# Patient Record
Sex: Male | Born: 1990 | Race: Black or African American | Hispanic: No | Marital: Single | Smoking: Current some day smoker
Health system: Southern US, Community
[De-identification: ages and names within clinical notes are randomized; demographics above are authoritative.]

## PROBLEM LIST (undated history)

## (undated) DIAGNOSIS — F1020 Alcohol dependence, uncomplicated: Secondary | ICD-10-CM

---

## 2012-03-26 LAB — COMPREHENSIVE METABOLIC PANEL
Albumin: 3.9 g/dL (ref 3.8–5.6)
Alkaline Phosphatase: 85 U/L — ABNORMAL LOW (ref 98–317)
Anion Gap: 6 — ABNORMAL LOW (ref 7–16)
BUN: 11 mg/dL (ref 7–18)
Bilirubin,Total: 0.2 mg/dL (ref 0.2–1.0)
Calcium, Total: 9 mg/dL (ref 9.0–10.7)
Creatinine: 1.14 mg/dL (ref 0.60–1.30)
Glucose: 83 mg/dL (ref 65–99)
Potassium: 4 mmol/L (ref 3.5–5.1)
SGOT(AST): 27 U/L (ref 10–41)
SGPT (ALT): 16 U/L (ref 12–78)
Total Protein: 8 g/dL (ref 6.4–8.6)

## 2012-03-26 LAB — CBC
HCT: 41.1 % (ref 40.0–52.0)
HGB: 13.7 g/dL (ref 13.0–18.0)
MCHC: 33.4 g/dL (ref 32.0–36.0)
MCV: 90 fL (ref 80–100)
Platelet: 236 10*3/uL (ref 150–440)
RBC: 4.59 10*6/uL (ref 4.40–5.90)
WBC: 7.4 10*3/uL (ref 3.8–10.6)

## 2012-03-26 LAB — URINALYSIS, COMPLETE
Bacteria: NONE SEEN
Blood: NEGATIVE
Glucose,UR: NEGATIVE mg/dL (ref 0–75)
Ketone: NEGATIVE
Leukocyte Esterase: NEGATIVE
Protein: NEGATIVE
Specific Gravity: 1.024 (ref 1.003–1.030)
WBC UR: 1 /HPF (ref 0–5)

## 2012-03-26 LAB — DRUG SCREEN, URINE
Barbiturates, Ur Screen: NEGATIVE (ref ?–200)
Benzodiazepine, Ur Scrn: NEGATIVE (ref ?–200)
Cannabinoid 50 Ng, Ur ~~LOC~~: POSITIVE (ref ?–50)
Cocaine Metabolite,Ur ~~LOC~~: NEGATIVE (ref ?–300)
MDMA (Ecstasy)Ur Screen: NEGATIVE (ref ?–500)
Phencyclidine (PCP) Ur S: NEGATIVE (ref ?–25)
Tricyclic, Ur Screen: NEGATIVE (ref ?–1000)

## 2012-03-26 LAB — TSH: Thyroid Stimulating Horm: 1.26 u[IU]/mL

## 2012-03-26 LAB — ETHANOL: Ethanol: <3 mg/dL

## 2012-03-26 LAB — ACETAMINOPHEN LEVEL: Acetaminophen: <2 ug/mL

## 2012-03-27 ENCOUNTER — Inpatient Hospital Stay: Payer: Self-pay | Admitting: Psychiatry

## 2012-04-17 ENCOUNTER — Emergency Department: Payer: Self-pay | Admitting: Emergency Medicine

## 2012-10-19 ENCOUNTER — Emergency Department: Payer: Self-pay | Admitting: Emergency Medicine

## 2014-11-05 NOTE — H&P (Signed)
PATIENT NAME:  Ryan Graham, TRAVERRIS O MR#:  [id number] DATE OF BIRTH:  [birth year]  DATE OF ADMISSION:  03/27/2012  REFERRING PHYSICIAN: Daryel NovemberJonathan Williams, MD   ATTENDING PHYSICIAN: Kristine LineaJolanta Yazaira Speas, MD    IDENTIFYING DATA: Mr. Ryan Graham is a 24 year old male with no past psychiatric history.   CHIEF COMPLAINT: "My thoughts are racing".   HISTORY OF PRESENT ILLNESS: Mr. Ryan Graham came to the Emergency Room after he overdosed on Bactrim. He could not explain his reasoning but reports that he has been suffering racing thoughts, mood swings, hyperactivity, lots of energy, some confusion at times that prevent him from achieving in life what he set out to do. 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 He says that he frequently has difficulties completing tasks, is scatterbrained and doing no good. He reports that he had a cyst removed and was prescribed narcotic pain killers for it but ran out very quickly. He was also prescribed the Bactrim. He stopped taking it at some point and that is why he had leftover Bactrim at home. He does not complain of any pain or discomfort at the site of surgery. He denies frank psychotic symptoms. He denies alcohol, prescription pills, or illicit drug abuse.   PAST PSYCHIATRIC HISTORY: He states that he does not remember anytime in his past where his thoughts were not racing and when it was not difficult for him to focus. He denies any prior psychiatric treatment. No suicide attempts. No substance abuse problems.   FAMILY PSYCHIATRIC HISTORY: None reported.   PAST MEDICAL HISTORY: None.   ALLERGIES: No known drug allergies.   MEDICATIONS ON ADMISSION: None.   SOCIAL HISTORY: As above, he lives with his aunt and uncle. His mother reportedly has a drug problem and has been in and out of his life. He graduated from high school and got  enrolled in community college classes. He does not have health insurance but most likely would qualify for Medicaid.  REVIEW OF SYSTEMS: CONSTITUTIONAL: No fevers or chills. No weight changes. EYES: No double or blurred vision. ENT: No hearing loss. RESPIRATORY: No shortness of breath or cough. CARDIOVASCULAR: No chest pain or orthopnea. GASTROINTESTINAL: No abdominal pain, nausea, vomiting, or diarrhea. GU: No incontinence or frequency. ENDOCRINE: No heat or cold intolerance. LYMPHATIC: No anemia or easy bruising. INTEGUMENTARY: No acne or rash. MUSCULOSKELETAL: No muscle or joint pain. NEUROLOGIC: No tingling or weakness. PSYCHIATRIC: See history of present illness for details.   PHYSICAL EXAMINATION:   VITAL SIGNS: Blood pressure 107/59, pulse 57, respirations 20, temperature 98.2.   GENERAL: This is a slender tall young male in no acute distress.   HEENT: The pupils are equal, round, and reactive to light. Sclerae anicteric.   NECK: Supple. No thyromegaly.   LUNGS: Clear to auscultation. No dullness to percussion.   HEART: Regular rhythm and rate. No murmurs, rubs, or gallops.   ABDOMEN: Soft, nontender, nondistended. Positive bowel sounds.   MUSCULOSKELETAL: Normal muscle strength in all extremities.   SKIN: No rashes or bruises.   LYMPHATIC: No cervical adenopathy.   NEUROLOGIC: Cranial nerves II through XII are intact. Normal gait.   LABORATORY DATA: Chemistries are within normal limits. Blood alcohol level 0. LFTs within normal limits. TSH 1.26. Urine tox screen positive for cannabinoids. CBC within normal limits. Urinalysis is not suggestive of urinary tract infection.  Serum acetaminophen and salicylates are low.   EKG sinus bradycardia, septal infarct of undetermined age. Abnormal EKG.  MENTAL STATUS EXAMINATION ON ADMISSION: The patient is examined in the Emergency Room. He is alert and oriented to person, place, time, and situation. He is pleasant, polite, and cooperative.  He is wearing burgundy scrubs and a yellow shirt. He maintains good eye contact. His speech is soft. Mood is depressed with flat affect. Thought processing is logical and goal oriented. Thought content he denies suicidal or homicidal ideation but was admitted after a suicide attempt by Bactrim overdose. There are no delusions or paranoia. There are no auditory or visual hallucinations. His cognition is grossly intact. He registers 3 out of 3 and recalls 3 out of 3 objects after five minutes. He can spell world forward and backward. He knows the current president. His insight and judgment are questionable.   SUICIDE RISK ASSESSMENT ON ADMISSION: This is a patient with no past psychiatric history who attempted suicide when overwhelmed with the stress of starting school. He is no longer suicidal and is able to contract for safety.   ASSESSMENT:  AXIS I:  1. Mood disorder, not otherwise specified.  2. Marijuana abuse.   AXIS II: Deferred.   AXIS III: Deferred.   AXIS IV: Mental illness, access to care, stress of school.   AXIS V: GAF on admission 25.   PLAN: The patient was admitted to [facility] Medicine Unit for safety, stabilization, and medication management. He was initially placed on suicide precautions and was closely monitored for any unsafe behaviors. He underwent full psychiatric and risk assessment. He received pharmacotherapy, individual and group psychotherapy, substance abuse counseling, and support from therapeutic milieu.  1. Suicidal ideation. This has resolved.  2. Mood. Will start him on Tegretol for mood stabilization and trazodone for sleep.  3. Disposition. He will be discharged to home with his mother.   ____________________________ Ellin Goodie. Jennet Maduro, MD jbp:drc D: 03/28/2012 17:42:14 ET T: 03/29/2012 06:05:28 ET JOB#: [id number]  cc: Palma Buster B. Jennet Maduro, MD, <Dictator> Shari Prows MD ELECTRONICALLY SIGNED 03/31/2012 9:01

## 2014-11-05 NOTE — Consult Note (Signed)
Brief Consult Note: Diagnosis: Mood disorder NOS.   Patient was seen by consultant.   Recommend to proceed with surgery or procedure.   Recommend further assessment or treatment.   Orders entered.   Discussed with Attending MD.   Comments: Will admit to psychiatry.  Electronic Signatures: Kristine LineaPucilowska, Rema Lievanos (MD)  (Signed 09-Sep-13 14:54)  Authored: Brief Consult Note   Last Updated: 09-Sep-13 14:54 by Kristine LineaPucilowska, Loi Rennaker (MD)

## 2018-10-12 ENCOUNTER — Encounter: Payer: Self-pay | Admitting: Emergency Medicine

## 2018-10-12 ENCOUNTER — Emergency Department
Admission: EM | Admit: 2018-10-12 | Discharge: 2018-10-12 | Disposition: A | Discharge status: Home / Self Care | Payer: Self-pay | Attending: Emergency Medicine | Admitting: Emergency Medicine

## 2018-10-12 ENCOUNTER — Other Ambulatory Visit: Payer: Self-pay

## 2018-10-12 DIAGNOSIS — K047 Periapical abscess without sinus: Secondary | ICD-10-CM | POA: Insufficient documentation

## 2018-10-12 DIAGNOSIS — F17200 Nicotine dependence, unspecified, uncomplicated: Secondary | ICD-10-CM | POA: Insufficient documentation

## 2018-10-12 MED ORDER — TRAMADOL HCL 50 MG PO TABS: 50.0000 mg | ORAL_TABLET | Freq: Four times a day (QID) | ORAL | 0 refills | Status: DC | PRN

## 2018-10-12 MED ORDER — CEFTRIAXONE SODIUM 1 G IJ SOLR
1.0000 g | Freq: Once | INTRAMUSCULAR | Status: AC
  Administered 2018-10-12: 1 g via INTRAMUSCULAR
  Filled 2018-10-12: qty 10

## 2018-10-12 MED ORDER — LIDOCAINE HCL (PF) 1 % IJ SOLN
5.0000 mL | Freq: Once | INTRAMUSCULAR | Status: AC
  Administered 2018-10-12: 5 mL via INTRADERMAL
  Filled 2018-10-12: qty 5

## 2018-10-12 MED ORDER — AMOXICILLIN 500 MG PO CAPS: 500.0000 mg | ORAL_CAPSULE | Freq: Three times a day (TID) | ORAL | 0 refills | Status: DC

## 2018-10-12 NOTE — ED Notes (Signed)
See triage note presents with possible dental abscess  Hx of same

## 2018-10-12 NOTE — ED Provider Notes (Signed)
[facility] Emergency Department Provider Note  ____________________________________________   First MD Initiated Contact with Patient 10/12/18 1300     (approximate)  I have reviewed the triage vital signs and the nursing notes.   HISTORY  Chief Complaint Dental Pain    HPI Ryan Graham is a 28 y.o. male presents emergency department complaining of a large dental abscess to the left lower molar.  Symptoms since last Sunday.  He denies fever or chills.  He states he starting to hurt into the jaw and into his ear.  He denies chest pain or shortness of breath.  He does not have a regular dentist but he did see 1 a year ago.    History reviewed. No pertinent past medical history.  There are no active problems to display for this patient.   History reviewed. No pertinent surgical history.  Prior to Admission medications   Medication Sig Start Date End Date Taking? Authorizing Provider  amoxicillin (AMOXIL) 500 MG capsule Take 1 capsule (500 mg total) by mouth 3 (three) times daily. 10/12/18   Beatrice Ziehm, Roselyn Bering, PA-C  traMADol (ULTRAM) 50 MG tablet Take 1 tablet (50 mg total) by mouth every 6 (six) hours as needed. 10/12/18   Faythe Ghee, PA-C    Allergies Patient has no known allergies.  No family history on file.  Social History Social History   Tobacco Use  . Smoking status: Current Some Day Smoker  . Smokeless tobacco: Never Used  Substance Use Topics  . Alcohol use: Not on file  . Drug use: Not on file    Review of Systems  Constitutional: No fever/chills Eyes: No visual changes. ENT: No sore throat.  Positive dental pain and swelling Respiratory: Denies cough Genitourinary: Negative for dysuria. Musculoskeletal: Negative for back pain. Skin: Negative for rash.    ____________________________________________   PHYSICAL EXAM:  VITAL SIGNS: ED Triage Vitals  Enc Vitals Group     BP 10/12/18 1245 132/69     Pulse  Rate 10/12/18 1245 71     Resp 10/12/18 1245 18     Temp 10/12/18 1245 98.8 F (37.1 C)     Temp Source 10/12/18 1245 Oral     SpO2 10/12/18 1245 100 %     Weight 10/12/18 1246 140 lb (63.5 kg)     Height 10/12/18 1246 6\' 3"  (1.905 m)     Head Circumference --      Peak Flow --      Pain Score 10/12/18 1246 10     Pain Loc --      Pain Edu? --      Excl. in GC? --     Constitutional: Alert and oriented. Well appearing and in no acute distress. Eyes: Conjunctivae are normal.  Head: Atraumatic.  Positive for large amount of swelling noted to the left lower jaw Nose: No congestion/rhinnorhea. Mouth/Throat: Mucous membranes are moist.  Positive for a large amount of swelling at the left lower gum.  Area is not fluctuant but is very swollen and tender Neck:  supple no lymphadenopathy noted Cardiovascular: Normal rate, regular rhythm. Heart sounds are normal Respiratory: Normal respiratory effort.  No retractions, lungs c t a  GU: deferred Musculoskeletal: FROM all extremities, warm and well perfused Neurologic:  Normal speech and language.  Skin:  Skin is warm, dry and intact. No rash noted. Psychiatric: Mood and affect are normal. Speech and behavior are normal.  ____________________________________________   LABS (all labs ordered  are listed, but only abnormal results are displayed)  Labs Reviewed - No data to display ____________________________________________   ____________________________________________  RADIOLOGY    ____________________________________________   PROCEDURES  Procedure(s) performed: Rocephin 1 g IM  Procedures    ____________________________________________   INITIAL IMPRESSION / ASSESSMENT AND PLAN / ED COURSE  Pertinent labs & imaging results that were available during my care of the patient were reviewed by me and considered in my medical decision making (see chart for details).   Patient is 28 year old male presents emergency  department, complaining of a dental abscess for 4 days.  Patient denies chest pain shortness of breath or fever.  Physical exam shows a large amount of swelling noted to the left lower jaw.  Gum is grossly swollen no fluctuance is noted.  Patient does have poor dentition.  Due to the amount of infection the patient was given Rocephin 1 g IM.  He was then given a prescription for amoxicillin and tramadol.  He is to follow-up with his regular dentist.  States he understands will comply.  He was discharged in stable condition.     As part of my medical decision making, I reviewed the following data within the electronic MEDICAL RECORD NUMBER Nursing notes reviewed and incorporated, Old chart reviewed, Notes from prior ED visits and [place] Controlled Substance Database  ____________________________________________   FINAL CLINICAL IMPRESSION(S) / ED DIAGNOSES  Final diagnoses:  Dental abscess      NEW MEDICATIONS STARTED DURING THIS VISIT:  New Prescriptions   AMOXICILLIN (AMOXIL) 500 MG CAPSULE    Take 1 capsule (500 mg total) by mouth 3 (three) times daily.   TRAMADOL (ULTRAM) 50 MG TABLET    Take 1 tablet (50 mg total) by mouth every 6 (six) hours as needed.     Note:  This document was prepared using Dragon voice recognition software and may include unintentional dictation errors.    Faythe Ghee, PA-C 10/12/18 1433    Dionne Bucy, MD 10/12/18 2115

## 2018-10-12 NOTE — Discharge Instructions (Addendum)
Follow-up with your regular dentist for evaluation of the dental abscess.  Return emergency department worsening.  Use medications as prescribed.  Also take Tylenol and ibuprofen to help with pain and swelling.

## 2018-10-12 NOTE — ED Triage Notes (Signed)
Patient presents to the ED with left sided dental pain that began on Sunday.  Patient's left cheek appears swollen.  Patient appears uncomfortable.

## 2019-02-23 ENCOUNTER — Ambulatory Visit: Payer: Self-pay

## 2019-03-02 ENCOUNTER — Ambulatory Visit: Payer: Self-pay

## 2019-03-07 ENCOUNTER — Ambulatory Visit: Payer: Self-pay

## 2019-03-14 ENCOUNTER — Ambulatory Visit: Payer: Self-pay | Admitting: Physician Assistant

## 2019-03-14 ENCOUNTER — Encounter: Payer: Self-pay | Admitting: Physician Assistant

## 2019-03-14 ENCOUNTER — Other Ambulatory Visit: Payer: Self-pay

## 2019-03-14 DIAGNOSIS — Z202 Contact with and (suspected) exposure to infections with a predominantly sexual mode of transmission: Secondary | ICD-10-CM

## 2019-03-14 DIAGNOSIS — Z113 Encounter for screening for infections with a predominantly sexual mode of transmission: Secondary | ICD-10-CM

## 2019-03-14 MED ORDER — AZITHROMYCIN 500 MG PO TABS
1000.0000 mg | ORAL_TABLET | Freq: Once | ORAL | Status: AC
  Administered 2019-03-14: 1000 mg via ORAL

## 2019-03-14 MED ORDER — CEFTRIAXONE SODIUM 250 MG IJ SOLR
250.0000 mg | Freq: Once | INTRAMUSCULAR | Status: AC
  Administered 2019-03-14: 250 mg via INTRAMUSCULAR

## 2019-03-14 NOTE — Progress Notes (Signed)
    STI clinic/screening visit  Subjective:  Ryan Graham is a 28 y.o. male being seen today for an STI screening visit. The patient reports they do have symptoms.  Patient has the following medical conditions:  There are no active problems to display for this patient.    Chief Complaint  Patient presents with  . SEXUALLY TRANSMITTED DISEASE    HPI  Patient reports that he is a contact to [facility] and having symptoms.  Declines screening exam, testing and blood work today.  Requests treatment as a contact only.  See flowsheet for further details and programmatic requirements.    The following portions of the patient's history were reviewed and updated as appropriate: allergies, current medications, past medical history, past social history, past surgical history and problem list.  Objective:  There were no vitals filed for this visit.  Physical Exam Constitutional:      General: He is not in acute distress.    Appearance: Normal appearance.  HENT:     Head: Normocephalic and atraumatic.  Pulmonary:     Effort: Pulmonary effort is normal.  Neurological:     Mental Status: He is alert and oriented to person, place, and time.  Psychiatric:        Mood and Affect: Mood normal.        Behavior: Behavior normal.        Thought Content: Thought content normal.        Judgment: Judgment normal.       Assessment and Plan:  Ryan Graham is a 28 y.o. male presenting to the [facility] Department for STI screening  1. Screening for STD (sexually transmitted disease) Patient is having symptoms and is a contact to [facility]. Rec condoms with all sex.  2. Gonorrhea contact Will treat with Ceftriaxone 250 mg IM and Azithromycin 1g po DOT today. No sex for 7 days and until after partner completes treatment. RTC if vomits <2 hr after taking medicine for re-treatment.  - azithromycin (ZITHROMAX) tablet 1,000 mg - cefTRIAXone (ROCEPHIN) injection 250 mg     No  follow-ups on file.  No future appointments.  Jerene Dilling, PA

## 2020-10-07 ENCOUNTER — Ambulatory Visit: Payer: Self-pay

## 2021-03-09 ENCOUNTER — Ambulatory Visit: Payer: Self-pay

## 2021-06-22 ENCOUNTER — Other Ambulatory Visit: Payer: Self-pay

## 2021-06-22 ENCOUNTER — Emergency Department (HOSPITAL_COMMUNITY): Payer: Self-pay

## 2021-06-22 ENCOUNTER — Encounter (HOSPITAL_COMMUNITY): Payer: Self-pay | Admitting: Emergency Medicine

## 2021-06-22 ENCOUNTER — Inpatient Hospital Stay (HOSPITAL_COMMUNITY)
Admission: EM | Admit: 2021-06-22 | Discharge: 2021-06-24 | DRG: 894 | Discharge status: Against Medical Advice | Payer: Self-pay | Attending: Critical Care Medicine | Admitting: Critical Care Medicine

## 2021-06-22 DIAGNOSIS — R569 Unspecified convulsions: Secondary | ICD-10-CM

## 2021-06-22 DIAGNOSIS — M6282 Rhabdomyolysis: Secondary | ICD-10-CM | POA: Diagnosis present

## 2021-06-22 DIAGNOSIS — E876 Hypokalemia: Secondary | ICD-10-CM | POA: Insufficient documentation

## 2021-06-22 DIAGNOSIS — F10239 Alcohol dependence with withdrawal, unspecified: Principal | ICD-10-CM | POA: Diagnosis present

## 2021-06-22 DIAGNOSIS — Z781 Physical restraint status: Secondary | ICD-10-CM

## 2021-06-22 DIAGNOSIS — Z20822 Contact with and (suspected) exposure to covid-19: Secondary | ICD-10-CM | POA: Diagnosis present

## 2021-06-22 DIAGNOSIS — G9341 Metabolic encephalopathy: Secondary | ICD-10-CM | POA: Diagnosis present

## 2021-06-22 DIAGNOSIS — F10939 Alcohol use, unspecified with withdrawal, unspecified: Secondary | ICD-10-CM | POA: Diagnosis present

## 2021-06-22 DIAGNOSIS — F1910 Other psychoactive substance abuse, uncomplicated: Secondary | ICD-10-CM

## 2021-06-22 DIAGNOSIS — F1513 Other stimulant abuse with withdrawal: Secondary | ICD-10-CM | POA: Diagnosis present

## 2021-06-22 DIAGNOSIS — D696 Thrombocytopenia, unspecified: Secondary | ICD-10-CM | POA: Diagnosis present

## 2021-06-22 DIAGNOSIS — F17210 Nicotine dependence, cigarettes, uncomplicated: Secondary | ICD-10-CM | POA: Diagnosis present

## 2021-06-22 DIAGNOSIS — R451 Restlessness and agitation: Secondary | ICD-10-CM | POA: Diagnosis present

## 2021-06-22 LAB — COMPREHENSIVE METABOLIC PANEL
ALT: 63 U/L — ABNORMAL HIGH (ref 0–44)
ALT: 54 U/L — ABNORMAL HIGH (ref 0–44)
ALT: 54 U/L — ABNORMAL HIGH (ref 0–44)
AST: 141 U/L — ABNORMAL HIGH (ref 15–41)
AST: 141 U/L — ABNORMAL HIGH (ref 15–41)
AST: 144 U/L — ABNORMAL HIGH (ref 15–41)
Albumin: 4.4 g/dL (ref 3.5–5.0)
Albumin: 3.8 g/dL (ref 3.5–5.0)
Albumin: 3.6 g/dL (ref 3.5–5.0)
Alkaline Phosphatase: 113 U/L (ref 38–126)
Alkaline Phosphatase: 94 U/L (ref 38–126)
Alkaline Phosphatase: 83 U/L (ref 38–126)
Anion gap: 10 (ref 5–15)
Anion gap: 9 (ref 5–15)
Anion gap: 9 (ref 5–15)
BUN: <5 mg/dL — ABNORMAL LOW (ref 6–20)
BUN: <5 mg/dL — ABNORMAL LOW (ref 6–20)
BUN: <5 mg/dL — ABNORMAL LOW (ref 6–20)
CO2: 26 mmol/L (ref 22–32)
CO2: 23 mmol/L (ref 22–32)
CO2: 24 mmol/L (ref 22–32)
Calcium: 9.7 mg/dL (ref 8.9–10.3)
Calcium: 9.1 mg/dL (ref 8.9–10.3)
Calcium: 8.8 mg/dL — ABNORMAL LOW (ref 8.9–10.3)
Chloride: 97 mmol/L — ABNORMAL LOW (ref 98–111)
Chloride: 99 mmol/L (ref 98–111)
Chloride: 98 mmol/L (ref 98–111)
Creatinine, Ser: 0.97 mg/dL (ref 0.61–1.24)
Creatinine, Ser: 0.97 mg/dL (ref 0.61–1.24)
Creatinine, Ser: 0.98 mg/dL (ref 0.61–1.24)
GFR, Estimated: >60 mL/min (ref >60)
GFR, Estimated: >60 mL/min (ref >60)
GFR, Estimated: >60 mL/min (ref >60)
Glucose, Bld: 108 mg/dL — ABNORMAL HIGH (ref 70–99)
Glucose, Bld: 99 mg/dL (ref 70–99)
Glucose, Bld: 107 mg/dL — ABNORMAL HIGH (ref 70–99)
Potassium: 3 mmol/L — ABNORMAL LOW (ref 3.5–5.1)
Potassium: 3.8 mmol/L (ref 3.5–5.1)
Potassium: 3.5 mmol/L (ref 3.5–5.1)
Sodium: 133 mmol/L — ABNORMAL LOW (ref 135–145)
Sodium: 131 mmol/L — ABNORMAL LOW (ref 135–145)
Sodium: 131 mmol/L — ABNORMAL LOW (ref 135–145)
Total Bilirubin: 0.6 mg/dL (ref 0.3–1.2)
Total Bilirubin: 0.6 mg/dL (ref 0.3–1.2)
Total Bilirubin: 0.7 mg/dL (ref 0.3–1.2)
Total Protein: 8.1 g/dL (ref 6.5–8.1)
Total Protein: 7.2 g/dL (ref 6.5–8.1)
Total Protein: 6.9 g/dL (ref 6.5–8.1)

## 2021-06-22 LAB — CBC WITH DIFFERENTIAL/PLATELET
Abs Immature Granulocytes: 0.04 10*3/uL (ref 0.00–0.07)
Basophils Absolute: 0 10*3/uL (ref 0.0–0.1)
Basophils Relative: 0 %
Eosinophils Absolute: 0 10*3/uL (ref 0.0–0.5)
Eosinophils Relative: 0 %
HCT: 39.9 % (ref 39.0–52.0)
Hemoglobin: 14 g/dL (ref 13.0–17.0)
Immature Granulocytes: 1 %
Lymphocytes Relative: 13 %
Lymphs Abs: 1.1 10*3/uL (ref 0.7–4.0)
MCH: 32.7 pg (ref 26.0–34.0)
MCHC: 35.1 g/dL (ref 30.0–36.0)
MCV: 93.2 fL (ref 80.0–100.0)
Monocytes Absolute: 0.9 10*3/uL (ref 0.1–1.0)
Monocytes Relative: 11 %
Neutro Abs: 6.3 10*3/uL (ref 1.7–7.7)
Neutrophils Relative %: 75 %
Platelets: 159 10*3/uL (ref 150–400)
RBC: 4.28 MIL/uL (ref 4.22–5.81)
RDW: 17 % — ABNORMAL HIGH (ref 11.5–15.5)
WBC: 8.4 10*3/uL (ref 4.0–10.5)
nRBC: 0 % (ref 0.0–0.2)

## 2021-06-22 LAB — HEPATITIS PANEL, ACUTE
HCV Ab: NONREACTIVE
Hep A IgM: NONREACTIVE
Hep B C IgM: NONREACTIVE
Hepatitis B Surface Ag: NONREACTIVE

## 2021-06-22 LAB — CBC
HCT: 35.9 % — ABNORMAL LOW (ref 39.0–52.0)
Hemoglobin: 12.5 g/dL — ABNORMAL LOW (ref 13.0–17.0)
MCH: 32.6 pg (ref 26.0–34.0)
MCHC: 34.8 g/dL (ref 30.0–36.0)
MCV: 93.5 fL (ref 80.0–100.0)
Platelets: 132 10*3/uL — ABNORMAL LOW (ref 150–400)
RBC: 3.84 MIL/uL — ABNORMAL LOW (ref 4.22–5.81)
RDW: 16.7 % — ABNORMAL HIGH (ref 11.5–15.5)
WBC: 8.8 10*3/uL (ref 4.0–10.5)
nRBC: 0 % (ref 0.0–0.2)

## 2021-06-22 LAB — RAPID URINE DRUG SCREEN, HOSP PERFORMED
Amphetamines: POSITIVE — AB
Barbiturates: NOT DETECTED
Benzodiazepines: NOT DETECTED
Cocaine: NOT DETECTED
Opiates: NOT DETECTED
Tetrahydrocannabinol: NOT DETECTED

## 2021-06-22 LAB — ACETAMINOPHEN LEVEL: Acetaminophen (Tylenol), Serum: <10 ug/mL — ABNORMAL LOW (ref 10–30)

## 2021-06-22 LAB — CK
Total CK: 3786 U/L — ABNORMAL HIGH (ref 49–397)
Total CK: 4812 U/L — ABNORMAL HIGH (ref 49–397)

## 2021-06-22 LAB — CBG MONITORING, ED: Glucose-Capillary: 131 mg/dL — ABNORMAL HIGH (ref 70–99)

## 2021-06-22 LAB — GLUCOSE, CAPILLARY: Glucose-Capillary: 105 mg/dL — ABNORMAL HIGH (ref 70–99)

## 2021-06-22 LAB — RESP PANEL BY RT-PCR (FLU A&B, COVID) ARPGX2
Influenza A by PCR: NEGATIVE
Influenza B by PCR: NEGATIVE
SARS Coronavirus 2 by RT PCR: NEGATIVE

## 2021-06-22 LAB — MAGNESIUM: Magnesium: 1.7 mg/dL (ref 1.7–2.4)

## 2021-06-22 LAB — ETHANOL: Alcohol, Ethyl (B): <10 mg/dL (ref <10)

## 2021-06-22 LAB — HIV ANTIBODY (ROUTINE TESTING W REFLEX): HIV Screen 4th Generation wRfx: NONREACTIVE

## 2021-06-22 LAB — SALICYLATE LEVEL: Salicylate Lvl: <7 mg/dL — ABNORMAL LOW (ref 7.0–30.0)

## 2021-06-22 LAB — MRSA NEXT GEN BY PCR, NASAL: MRSA by PCR Next Gen: NOT DETECTED

## 2021-06-22 MED ORDER — LORAZEPAM 2 MG/ML IJ SOLN
0.0000 mg | Freq: Four times a day (QID) | INTRAMUSCULAR | Status: DC
  Administered 2021-06-22: 4 mg via INTRAVENOUS
  Filled 2021-06-22: qty 2

## 2021-06-22 MED ORDER — MAGNESIUM SULFATE 2 GM/50ML IV SOLN
2.0000 g | Freq: Once | INTRAVENOUS | Status: AC
  Administered 2021-06-22: 2 g via INTRAVENOUS
  Filled 2021-06-22: qty 50

## 2021-06-22 MED ORDER — ENOXAPARIN SODIUM 40 MG/0.4ML IJ SOSY
40.0000 mg | PREFILLED_SYRINGE | Freq: Every day | INTRAMUSCULAR | Status: DC
  Administered 2021-06-22 – 2021-06-24 (×3): 40 mg via SUBCUTANEOUS
  Filled 2021-06-22 (×3): qty 0.4

## 2021-06-22 MED ORDER — LACTATED RINGERS IV BOLUS
2000.0000 mL | Freq: Once | INTRAVENOUS | Status: AC
  Administered 2021-06-22: 2000 mL via INTRAVENOUS

## 2021-06-22 MED ORDER — ADULT MULTIVITAMIN W/MINERALS CH
1.0000 | ORAL_TABLET | Freq: Every day | ORAL | Status: DC
  Administered 2021-06-23 – 2021-06-24 (×2): 1 via ORAL
  Filled 2021-06-22 (×3): qty 1

## 2021-06-22 MED ORDER — LORAZEPAM 2 MG/ML IJ SOLN
1.0000 mg | INTRAMUSCULAR | Status: DC | PRN
  Administered 2021-06-22 – 2021-06-23 (×5): 1 mg via INTRAVENOUS
  Filled 2021-06-22 (×5): qty 1

## 2021-06-22 MED ORDER — THIAMINE HCL 100 MG PO TABS
100.0000 mg | ORAL_TABLET | Freq: Every day | ORAL | Status: DC
  Administered 2021-06-23 – 2021-06-24 (×2): 100 mg via ORAL
  Filled 2021-06-22 (×2): qty 1

## 2021-06-22 MED ORDER — LORAZEPAM 1 MG PO TABS: 0.0000 mg | ORAL_TABLET | Freq: Four times a day (QID) | ORAL | Status: DC

## 2021-06-22 MED ORDER — ACETAMINOPHEN 325 MG PO TABS: 650.0000 mg | ORAL_TABLET | Freq: Four times a day (QID) | ORAL | Status: DC | PRN

## 2021-06-22 MED ORDER — LORAZEPAM 1 MG PO TABS: 0.0000 mg | ORAL_TABLET | Freq: Two times a day (BID) | ORAL | Status: DC

## 2021-06-22 MED ORDER — LORAZEPAM 2 MG/ML IJ SOLN
INTRAMUSCULAR | Status: AC
  Administered 2021-06-22: 2 mg
  Filled 2021-06-22: qty 1

## 2021-06-22 MED ORDER — SODIUM CHLORIDE 0.9 % IV SOLN: 1.0000 mg | Freq: Every day | INTRAVENOUS | Status: DC

## 2021-06-22 MED ORDER — LORAZEPAM 2 MG/ML IJ SOLN
2.0000 mg | Freq: Once | INTRAMUSCULAR | Status: AC
  Administered 2021-06-22: 2 mg via INTRAVENOUS
  Filled 2021-06-22: qty 1

## 2021-06-22 MED ORDER — LACTATED RINGERS IV SOLN: INTRAVENOUS | Status: DC

## 2021-06-22 MED ORDER — THIAMINE HCL 100 MG/ML IJ SOLN
Freq: Once | INTRAVENOUS | Status: AC
  Filled 2021-06-22: qty 1000

## 2021-06-22 MED ORDER — THIAMINE HCL 100 MG/ML IJ SOLN
100.0000 mg | Freq: Every day | INTRAMUSCULAR | Status: DC
  Filled 2021-06-22: qty 2

## 2021-06-22 MED ORDER — DEXMEDETOMIDINE HCL IN NACL 400 MCG/100ML IV SOLN
0.2000 ug/kg/h | INTRAVENOUS | Status: DC
  Administered 2021-06-22: 0.7 ug/kg/h via INTRAVENOUS
  Administered 2021-06-22: 1.8 ug/kg/h via INTRAVENOUS
  Administered 2021-06-22: 0.5 ug/kg/h via INTRAVENOUS
  Administered 2021-06-23 (×7): 1.8 ug/kg/h via INTRAVENOUS
  Administered 2021-06-24: 1 ug/kg/h via INTRAVENOUS
  Administered 2021-06-24: 1.8 ug/kg/h via INTRAVENOUS
  Administered 2021-06-24: 1.4 ug/kg/h via INTRAVENOUS
  Administered 2021-06-24: 1.8 ug/kg/h via INTRAVENOUS
  Filled 2021-06-22 (×8): qty 100
  Filled 2021-06-22: qty 200
  Filled 2021-06-22 (×4): qty 100

## 2021-06-22 MED ORDER — LORAZEPAM 2 MG/ML IJ SOLN
1.0000 mg | Freq: Once | INTRAMUSCULAR | Status: AC
  Administered 2021-06-22: 1 mg via INTRAVENOUS
  Filled 2021-06-22: qty 1

## 2021-06-22 MED ORDER — DOCUSATE SODIUM 100 MG PO CAPS: 100.0000 mg | ORAL_CAPSULE | Freq: Two times a day (BID) | ORAL | Status: DC | PRN

## 2021-06-22 MED ORDER — CHLORHEXIDINE GLUCONATE CLOTH 2 % EX PADS
6.0000 | MEDICATED_PAD | Freq: Every day | CUTANEOUS | Status: DC
  Administered 2021-06-22 – 2021-06-24 (×3): 6 via TOPICAL

## 2021-06-22 MED ORDER — LORAZEPAM 2 MG/ML IJ SOLN: 0.0000 mg | Freq: Two times a day (BID) | INTRAMUSCULAR | Status: DC

## 2021-06-22 MED ORDER — POLYETHYLENE GLYCOL 3350 17 G PO PACK: 17.0000 g | PACK | Freq: Every day | ORAL | Status: DC | PRN

## 2021-06-22 MED ORDER — POTASSIUM CHLORIDE 10 MEQ/100ML IV SOLN
10.0000 meq | INTRAVENOUS | Status: AC
  Administered 2021-06-22 (×4): 10 meq via INTRAVENOUS
  Filled 2021-06-22 (×4): qty 100

## 2021-06-22 MED ORDER — ACETAMINOPHEN 650 MG RE SUPP: 650.0000 mg | Freq: Four times a day (QID) | RECTAL | Status: DC | PRN

## 2021-06-22 MED ORDER — FOLIC ACID 5 MG/ML IJ SOLN
1.0000 mg | Freq: Every day | INTRAMUSCULAR | Status: DC
  Filled 2021-06-22: qty 0.2

## 2021-06-22 MED ORDER — NICOTINE 21 MG/24HR TD PT24
21.0000 mg | MEDICATED_PATCH | Freq: Every day | TRANSDERMAL | Status: DC
  Administered 2021-06-22 – 2021-06-24 (×4): 21 mg via TRANSDERMAL
  Filled 2021-06-22 (×4): qty 1

## 2021-06-22 MED ORDER — LORAZEPAM 2 MG/ML IJ SOLN: 1.0000 mg | INTRAMUSCULAR | Status: DC | PRN

## 2021-06-22 MED ORDER — LORAZEPAM 2 MG/ML IJ SOLN
2.0000 mg | Freq: Once | INTRAMUSCULAR | Status: AC
  Filled 2021-06-22: qty 1

## 2021-06-22 NOTE — ED Notes (Signed)
Pt appears very paranoid, alert to name only, continues to try to climb out of bed.

## 2021-06-22 NOTE — TOC CAGE-AID Note (Addendum)
Transition of Care [facility]) - CAGE-AID Screening   Patient Details  Name: Ryan Graham MRN: [id number] Date of Birth: [birth year]  Transition of Care [facility]) CM/SW Contact:    Tom-Johnson, Hershal Coria, RN Phone Number: 06/22/2021, 5:31 PM   Clinical Narrative: CM consulted forsubstance abuse counseling. Cage aid done. Education done and patient states he will go for counseling in the near future. States he is not an addict and would rather work on quitting himself. Offers made to patient and he declined. Online substance abuse counseling placed on AVS.    CAGE-AID Screening:    Have You Ever Felt You Ought to Cut Down on Your Drinking or Drug Use?: Yes (In the near future) Have People Annoyed You By Critizing Your Drinking Or Drug Use?: No Have You Felt Bad Or Guilty About Your Drinking Or Drug Use?: No Have You Ever Had a Drink or Used Drugs First Thing In The Morning to Steady Your Nerves or to Get Rid of a Hangover?: No CAGE-AID Score: 1  Substance Abuse Education Offered: Yes  Substance abuse interventions: Transport planner, Other (must comment) (Online substance abuse counseling placed on AVS.)

## 2021-06-22 NOTE — ED Notes (Addendum)
Pt hallucinating, talking to people in the room that aren't there.

## 2021-06-22 NOTE — Progress Notes (Signed)
eLink Physician-Brief Progress Note Patient Name: Ryan Graham DOB: [birth year] MRN: [id number]   Date of Service  06/22/2021  HPI/Events of Note  Patient with agitation from polysubstance withdrawal.  eICU Interventions  PRN Ativan indication expanded from just seizures to include active  withdrawal symptoms, soft wrist restraints ordered.        Thomasene Lot Harjot Zavadil 06/22/2021, 9:30 PM

## 2021-06-22 NOTE — ED Provider Notes (Signed)
Patient is beginning to worsen.  This now appears more consistent with alcohol withdrawal syndrome.  He is becoming more tachycardic, tremulous and is now starting to hallucinate. He received multiple doses of Ativan without any improvement. I have consulted critical care for evaluation, patient may need Precedex drip   Zadie Rhine, MD 06/22/21 [phone]

## 2021-06-22 NOTE — ED Notes (Signed)
Pt found standing in room after removing his IV and monitoring equipment. Pt's room and linens cleaned, new  IV inserted and pt assisted to the bathroom .

## 2021-06-22 NOTE — ED Provider Notes (Signed)
MSE was initiated and I personally evaluated the patient and placed orders (if any) at  1:43 AM on June 22, 2021.  Patient to ED with report of polysubstance abuse "last Monday" - took some pills, snorted something powdery, drank alcohol. He states none since. Per EMS, has been called out multiple times during the week (hotel), c/o dizziness, 'not feeling right'.  Today's Vitals   06/22/21 0131 06/22/21 0131  BP:  (!) 166/96  Pulse:  (!) 104  Resp:  18  Temp:  98.4 F (36.9 C)  TempSrc:  Oral  SpO2:  100%  PainSc: 0-No pain    There is no height or weight on file to calculate BMI.  Tremulous Restless Tachycardia  Labs ordered, head CT  DURING TRIAGE PROCESS patient started seizing with 2 back-to-back seizures lasting approximately 1 min each. Ativan 2 mg IV given. Patient responsive to questions after 2-3 min post-seizure. No sz history per patient.  The patient appears stable so that the remainder of the MSE may be completed by another provider.   Elpidio Anis, PA-C 06/22/21 0159    Zadie Rhine, MD 06/22/21 [phone]

## 2021-06-22 NOTE — ED Triage Notes (Signed)
Per EMS pt reports taking multiple unknown drugs the past 2 days.  Did snort cocaine as well.  Pt does not know what the names of all the drugs are he has done.  EMS was called for fall and hit head.  Pt feels "shaky" .  VSS per EMS.

## 2021-06-22 NOTE — ED Notes (Signed)
Additional 2mg  given; MD made aware

## 2021-06-22 NOTE — ED Notes (Signed)
Pt  climbed out of bed, removed cardiac wires, unable to obtain pulse ox and bp due to agitation

## 2021-06-22 NOTE — TOC Initial Note (Signed)
Transition of Care [facility]) - Initial/Assessment Note    Patient Details  Name: Ryan Graham MRN: [id number] Date of Birth: [birth year]  [facility]) CM/SW Contact:    Tom-Johnson, Hershal Coria, RN Phone Number: 06/22/2021, 5:41 PM  Clinical Narrative:                 CM consulted for substance abuse counseling and education. CM spoke with patient at bedside. Patient states he lives at home with his mother. Has two children. States he is not currently employed but listed that he works at [employer]. Does not have PCP and no medical insurance. Substance abuse education given to patient. He states he is not an addict and will quit on his own terms when he is ready. Declined resources. Online resources placed on AVS. Patient states he gets his pills from his uncle because of tooth ache. Does not have a dentist. CM scheduled hospital followup visit with [facility]. Patient's information sent to financial counselor for medicaid screening. CM will continue to follow with needs.     Expected Discharge Plan: Home/Self Care Barriers to Discharge: Continued Medical Work up   Patient Goals and CMS Choice Patient states their goals for this hospitalization and ongoing recovery are:: To go home CMS Medicare.gov Compare Post Acute Care list provided to:: Patient Choice offered to / list presented to : NA  Expected Discharge Plan and Services Expected Discharge Plan: Home/Self Care   Discharge Planning Services: CM Consult Post Acute Care Choice: NA Living arrangements for the past 2 months: Single Family Home                 DME Arranged: N/A DME Agency: NA       HH Arranged: NA HH Agency: NA        Prior Living Arrangements/Services Living arrangements for the past 2 months: Single Family Home Lives with:: Parents (Mother) Patient language and need for interpreter reviewed:: Yes Do you feel safe going back to the place where you live?: Yes      Need for Family Participation  in Patient Care: Yes (Comment) Care giver support system in place?: Yes (comment)   Criminal Activity/Legal Involvement Pertinent to Current Situation/Hospitalization: No - Comment as needed  Activities of Daily Living      Permission Sought/Granted Permission sought to share information with : Case Manager Permission granted to share information with : Yes, Verbal Permission Granted              Emotional Assessment Appearance:: Appears stated age Attitude/Demeanor/Rapport: Engaged Affect (typically observed): Accepting, Calm, Appropriate Orientation: : Oriented to Self, Oriented to Place Alcohol / Substance Use: Alcohol Use, Illicit Drugs Psych Involvement: No (comment)  Admission diagnosis:  Alcohol withdrawal (HCC) [F10.939] Seizure (HCC) [R56.9] Seizures (HCC) [R56.9] Polysubstance abuse (HCC) [F19.10] Non-traumatic rhabdomyolysis [M62.82] Patient Active Problem List   Diagnosis Date Noted   Seizures (HCC) 06/22/2021   Alcohol withdrawal (HCC) 06/22/2021   Non-traumatic rhabdomyolysis    Polysubstance abuse (HCC)    Hypokalemia    PCP:  Patient, No Pcp Per (Inactive) Pharmacy:   [id number] - [place], [place] - [address] [place] [address] Phone: [phone] Fax: [phone]     Social Determinants of Health (SDOH) Interventions    Readmission Risk Interventions No flowsheet data found.

## 2021-06-22 NOTE — H&P (Addendum)
NAME:  Ryan Graham, MRN:  [id number], DOB:  10-01-1990, LOS: 0 ADMISSION DATE:  06/22/2021, CONSULTATION DATE:  12/5 REFERRING MD:  Dr. Bebe Shaggy, CHIEF COMPLAINT:  seizure (possible alcohol withdrawal)   History of Present Illness:  Patient is a 30 year old male with no PMH presenting to [facility] ED on 12/5 after being found down in hotel and hitting his head.  Patient was limited to provide information.  He admitted to snorting cocaine and taking a handful of unknown pills.  He reported feeling shaky.  While in triage patient noted to have 2 seizures that lasted about 1 minute each.  Patient has been given Ativan with improvement.  Unknown alcohol history; states he drinks a few beers.  Patient is tachycardic.  UDS positive for amphetamines.  Alcohol, acetaminophen, salicylate level normal.  Head CT/spine normal.  CIWA protocol in place.  Patient requiring multiple doses of Ativan and becoming very agitated.  Starting on Precedex drip.  PCCM consulted for ICU admission while on Precedex drip.  Pertinent  Medical History  History reviewed. No pertinent past medical history.   Significant Hospital Events: Including procedures, antibiotic start and stop dates in addition to other pertinent events   12/5: admitted to [facility] for seizure likely due to alcohol withdrawal; moving to ICU to start on precedex drip  Interim History / Subjective:  Patient trying to crawl out of bed Follows some commands  Occasional hallucinations Tachy and mildly hypertensive On room air   Objective   Blood pressure (!) 153/94, pulse 85, temperature 98.7 F (37.1 C), temperature source Rectal, resp. rate 20, height 6\' 3"  (1.905 m), weight 63.5 kg, SpO2 98 %.        Intake/Output Summary (Last 24 hours) at 06/22/2021 14/11/2020 Last data filed at 06/22/2021 14/11/2020 Gross per 24 hour  Intake 2000 ml  Output --  Net 2000 ml    Filed Weights   06/22/21 0228  Weight: 63.5 kg    Examination: General:   agitated  young male in NAD HEENT: MM pink/moist Neuro: hallucinating; MAE; PERRL CV: s1s2, sinus tachy, no m/r/g PULM:  dim clear BS bilaterally; on room air GI: soft, bsx4 active  Extremities: warm/dry, no edema  Skin: no rashes or lesions appreciated    Resolved Hospital Problem list     Assessment & Plan:  Seizures: UDS positive for amphetamines; CT head negative Polysubstance abuse Possible alcohol withdrawal P: -admit to ICU for closer monitoring -receiving multiple ativan doses without improvement; will start on precedex -avoid beta blockers due to cocaine use -thiamine, folate -seizure precautions in place -monitor for signs of seizure activity -consider prn ativan for seizures  Elevated LFTs Mild Thrombocytopenia P: -trend CMP/CBC -supportive care  Rhabdomyolysis P: -likely related to seizures -IV fluids  Hypokalemia P: -giving mag/k -trend electrolytes and replete as needed    Best Practice (right click and "Reselect all SmartList Selections" daily)   Diet/type: NPO DVT prophylaxis: LMWH GI prophylaxis: N/A Lines: N/A Foley:  N/A Code Status:  full code Last date of multidisciplinary goals of care discussion [pending]  Labs   CBC: Recent Labs  Lab 06/22/21 0137 06/22/21 0546  WBC 8.4 8.8  NEUTROABS 6.3  --   HGB 14.0 12.5*  HCT 39.9 35.9*  MCV 93.2 93.5  PLT 159 132*     Basic Metabolic Panel: Recent Labs  Lab 06/22/21 0137  NA 133*  K 3.0*  CL 97*  CO2 26  GLUCOSE 108*  BUN <5*  CREATININE 0.97  CALCIUM 9.7    GFR: Estimated Creatinine Clearance: 101.8 mL/min (by C-G formula based on SCr of 0.97 mg/dL). Recent Labs  Lab 06/22/21 0137 06/22/21 0546  WBC 8.4 8.8     Liver Function Tests: Recent Labs  Lab 06/22/21 0137  AST 141*  ALT 63*  ALKPHOS 113  BILITOT 0.6  PROT 8.1  ALBUMIN 4.4    No results for input(s): LIPASE, AMYLASE in the last 168 hours. No results for input(s): AMMONIA in the last 168  hours.  ABG No results found for: PHART, PCO2ART, PO2ART, HCO3, TCO2, ACIDBASEDEF, O2SAT   Coagulation Profile: No results for input(s): INR, PROTIME in the last 168 hours.  Cardiac Enzymes: Recent Labs  Lab 06/22/21 0137  CKTOTAL 3,786*     HbA1C: No results found for: HGBA1C  CBG: Recent Labs  Lab 06/22/21 0158  GLUCAP 131*     Review of Systems:   Unable to obtain. Patient altered.  Past Medical History:  He,  has no past medical history on file.   Surgical History:  No past surgical history on file.   Social History:   reports that he has been smoking. He has never used smokeless tobacco.   Family History:  His family history is not on file.   Allergies No Known Allergies   Home Medications  Prior to Admission medications   Not on File     Critical care time: 45 minutes    JD Anselm Lis [place] [facility] 06/22/2021, 6:43 AM  Please see [email or web address] for pager details.  From 7A-7P if no response, please call [phone]. After hours, please call ELink [phone].

## 2021-06-22 NOTE — Progress Notes (Addendum)
NAME:  Ryan Graham, MRN:  [id number], DOB:  June 19, 1991, LOS: 0 ADMISSION DATE:  06/22/2021, CONSULTATION DATE:  12/5 REFERRING MD:  Dr. Bebe Shaggy, CHIEF COMPLAINT:  seizure (possible alcohol withdrawal)   History of Present Illness:  30 year old male with no PMH presenting to [facility] ED on 12/5 after being found down in hotel and apparently hitting his head.  Patient was limited to provide information.  He admitted to snorting cocaine and taking a handful of unknown pills.  He reported feeling shaky.  While in triage patient noted to have 2 seizures that lasted about 1 minute each.  Patient has been given Ativan with improvement.  Unknown alcohol history; states he drinks a few beers.  Patient is tachycardic.  UDS positive for amphetamines.  Alcohol, acetaminophen, salicylate level normal.  Head CT/spine normal.  CIWA protocol in place.  Patient requiring multiple doses of Ativan and becoming very agitated.  Starting on Precedex drip.  PCCM consulted for ICU admission while on Precedex drip.  Pertinent  Medical History  History reviewed. No pertinent past medical history.   Significant Hospital Events: Including procedures, antibiotic start and stop dates in addition to other pertinent events   12/5 Admitted to [facility] for seizure likely due to alcohol withdrawal; moving to ICU to start on precedex drip  Interim History / Subjective:  Afebrile  On RA  Precedex infusion at RN reports pt waxing / waning from sleeping to standing at side of bed, easily redirectable but not at baseline  Objective   Blood pressure (!) 153/94, pulse 85, temperature 98.7 F (37.1 C), temperature source Rectal, resp. rate 20, height 6\' 3"  (1.905 m), weight 63.5 kg, SpO2 98 %.        Intake/Output Summary (Last 24 hours) at 06/22/2021 14/11/2020 Last data filed at 06/22/2021 14/11/2020 Gross per 24 hour  Intake 2000 ml  Output --  Net 2000 ml   Filed Weights   06/22/21 0228  Weight: 63.5 kg     Examination: General: disheveled adult male lying in bed in NAD  HEENT: MM pink/moist, no JVD, anicteric  Neuro: sleeping on precedex, moves all extremities spontaneously  CV: s1s2 RRR, no m/r/g PULM: non-labored at rest, snoring while sleeping, lungs with referred snoring noise otherwise clear  GI: soft, bsx4 active  Extremities: warm/dry, no edema, dirty feet, nails  Skin: no rashes or lesions   Resolved Hospital Problem list     Assessment & Plan:   Seizures  Polysubstance abuse Possible alcohol withdrawal Acute Metabolic Encephalopathy  UDS positive for amphetamines; CT head, cervical spine negative. S/p multiple doses of ativan without significant improvement.  -monitor in ICU -continue precedex infusion  -seizure precautions  -avoid beta blockers with cocaine use  -continue thiamine, folate, MVI  -CIWA  Elevated LFTs Mild Thrombocytopenia -assess acute hepatitis panel  -follow CBC  -supportive care   Rhabdomyolysis Suspect in setting of volume depletion, seizure, unknown downtime  -continue IVF resuscitation, LR at 150 ml/hr  -follow up CK in am   Hypokalemia -replaced, follow up in am   Best Practice (right click and "Reselect all SmartList Selections" daily)  Diet/type: NPO DVT prophylaxis: LMWH GI prophylaxis: N/A Lines: N/A Foley:  N/A Code Status:  full code Last date of multidisciplinary goals of care discussion: Pending.  Called 14/05/22, no answer at [phone]. Will update family on arrival.   Critical care time: 31 minutes    [phone], MSN, APRN, NP-C, AGACNP-BC [facility] 06/22/2021,  7:22 AM   Please see [email or web address] for pager details.   From 7A-7P if no response, please call [phone] After hours, please call ELink [phone]

## 2021-06-22 NOTE — ED Notes (Signed)
Patient transported to CT 

## 2021-06-22 NOTE — ED Notes (Signed)
Breakfast orders placed 

## 2021-06-22 NOTE — ED Notes (Signed)
Pt removed all monitoring wires, attempting to get out bed, redirectable at present. CIWA 16, 2mg  given. Small hematoma/redness noted to LLQ, cardiac monitor replaced

## 2021-06-22 NOTE — ED Notes (Signed)
Pt tolerating ginger ale 

## 2021-06-22 NOTE — Progress Notes (Signed)
Patient evaluated by Dr Marcos Eke of PCCM in setting of concern for worsening alcohol withdrawal symptoms, hallucinations. Patient to be admitted by PCCM to the ICU.    Ryan Pigg, DO Hospitalist

## 2021-06-22 NOTE — ED Provider Notes (Signed)
[facility] EMERGENCY DEPARTMENT Provider Note   CSN: [id number] Arrival date & time: 06/22/21  0129     History Chief Complaint  Patient presents with   Dizziness   Level 5 caveat due to altered mental status Ryan Graham is a 29 y.o. male.  The history is provided by the patient. The history is limited by the condition of the patient.  Patient initially presented via EMS after taking multiple drugs over the past several days.  Apparently he was found at a motel after he had fallen and hit his head.  He reported feeling shaky.  He admitted to snorting cocaine and taking "handfuls" of unknown pills. While he was in triage he  began to have 2 seizures back-to-back that  lasted about a minute each.  He has been given Ativan and is improving.  No known seizure history    PMH-unknown Soc hx -unknown Social History   Tobacco Use   Smoking status: Some Days   Smokeless tobacco: Never    Home Medications Prior to Admission medications   Not on File    Allergies    Patient has no known allergies.  Review of Systems   Review of Systems  Unable to perform ROS: Mental status change   Physical Exam Updated Vital Signs BP (!) 166/96   Pulse (!) 104   Temp 98.7 F (37.1 C) (Rectal)   Resp 18   SpO2 100%   Physical Exam CONSTITUTIONAL: Disheveled, ill-appearing HEAD: Normocephalic/atraumatic no step-offs or crepitus EYES: EOMI/PERRL, pupils are dilated ENMT: Mucous membranes moist, no evidence of facial trauma.  No tongue lacerations. SPINE/BACK:entire spine nontender, no bruising/crepitance/stepoffs noted to spine CV: S1/S2 noted, tachycardic LUNGS: Lungs are clear to auscultation bilaterally, no apparent distress Chest-no visible trauma ABDOMEN: soft, nontender NEURO: Pt is awake/alert, moves all extremitiesx4.  No facial droop.  Patient is tremulous.  He follows commands and answers most questions appropriately EXTREMITIES: pulses  normal/equal, full ROM, all other extremities/joints palpated/ranged and nontender SKIN: warm, color normal PSYCH: Unable to fully assess ED Results / Procedures / Treatments   Labs (all labs ordered are listed, but only abnormal results are displayed) Labs Reviewed  CBC WITH DIFFERENTIAL/PLATELET - Abnormal; Notable for the following components:      Result Value   RDW 17.0 (*)    All other components within normal limits  COMPREHENSIVE METABOLIC PANEL - Abnormal; Notable for the following components:   Sodium 133 (*)    Potassium 3.0 (*)    Chloride 97 (*)    Glucose, Bld 108 (*)    BUN <5 (*)    AST 141 (*)    ALT 63 (*)    All other components within normal limits  RAPID URINE DRUG SCREEN, HOSP PERFORMED - Abnormal; Notable for the following components:   Amphetamines POSITIVE (*)    All other components within normal limits  ACETAMINOPHEN LEVEL - Abnormal; Notable for the following components:   Acetaminophen (Tylenol), Serum <10 (*)    All other components within normal limits  SALICYLATE LEVEL - Abnormal; Notable for the following components:   Salicylate Lvl <7.0 (*)    All other components within normal limits  CK - Abnormal; Notable for the following components:   Total CK 3,786 (*)    All other components within normal limits  CBG MONITORING, ED - Abnormal; Notable for the following components:   Glucose-Capillary 131 (*)    All other components within normal limits  RESP PANEL  BY RT-PCR (FLU A&B, COVID) ARPGX2  ETHANOL  HIV ANTIBODY (ROUTINE TESTING W REFLEX)  COMPREHENSIVE METABOLIC PANEL  MAGNESIUM  CBC  CK    EKG EKG Interpretation  Date/Time:  Monday June 22 2021 01:41:09 EST Ventricular Rate:  95 PR Interval:  126 QRS Duration: 88 QT Interval:  358 QTC Calculation: 449 R Axis:   87 Text Interpretation: Normal sinus rhythm Normal ECG Interpretation limited secondary to artifact Confirmed by Zadie Rhine ([id number]) on 06/22/2021 2:29:41  AM  Radiology CT Head Wo Contrast  Result Date: 06/22/2021 CLINICAL DATA:  Encephalopathy and trauma.  Cocaine use. EXAM: CT HEAD WITHOUT CONTRAST CT CERVICAL SPINE WITHOUT CONTRAST TECHNIQUE: Multidetector CT imaging of the head and cervical spine was performed following the standard protocol without intravenous contrast. Multiplanar CT image reconstructions of the cervical spine were also generated. COMPARISON:  None. FINDINGS: CT HEAD FINDINGS Brain: There is no mass, hemorrhage or extra-axial collection. The size and configuration of the ventricles and extra-axial CSF spaces are normal. The brain parenchyma is normal, without evidence of acute or chronic infarction. Vascular: No abnormal hyperdensity of the major intracranial arteries or dural venous sinuses. No intracranial atherosclerosis. Skull: The visualized skull base, calvarium and extracranial soft tissues are normal. Sinuses/Orbits: No fluid levels or advanced mucosal thickening of the visualized paranasal sinuses. No mastoid or middle ear effusion. The orbits are normal. CT CERVICAL SPINE FINDINGS Alignment: No static subluxation. Facets are aligned. Occipital condyles are normally positioned. Skull base and vertebrae: No acute fracture. Soft tissues and spinal canal: No prevertebral fluid or swelling. No visible canal hematoma. Disc levels: No advanced spinal canal or neural foraminal stenosis. Upper chest: No pneumothorax, pulmonary nodule or pleural effusion. Other: Normal visualized paraspinal cervical soft tissues. IMPRESSION: 1. No acute intracranial abnormality. 2. No acute fracture or static subluxation of the cervical spine. Electronically Signed   By: Deatra Robinson M.D.   On: 06/22/2021 03:02   CT Cervical Spine Wo Contrast  Result Date: 06/22/2021 CLINICAL DATA:  Encephalopathy and trauma.  Cocaine use. EXAM: CT HEAD WITHOUT CONTRAST CT CERVICAL SPINE WITHOUT CONTRAST TECHNIQUE: Multidetector CT imaging of the head and cervical  spine was performed following the standard protocol without intravenous contrast. Multiplanar CT image reconstructions of the cervical spine were also generated. COMPARISON:  None. FINDINGS: CT HEAD FINDINGS Brain: There is no mass, hemorrhage or extra-axial collection. The size and configuration of the ventricles and extra-axial CSF spaces are normal. The brain parenchyma is normal, without evidence of acute or chronic infarction. Vascular: No abnormal hyperdensity of the major intracranial arteries or dural venous sinuses. No intracranial atherosclerosis. Skull: The visualized skull base, calvarium and extracranial soft tissues are normal. Sinuses/Orbits: No fluid levels or advanced mucosal thickening of the visualized paranasal sinuses. No mastoid or middle ear effusion. The orbits are normal. CT CERVICAL SPINE FINDINGS Alignment: No static subluxation. Facets are aligned. Occipital condyles are normally positioned. Skull base and vertebrae: No acute fracture. Soft tissues and spinal canal: No prevertebral fluid or swelling. No visible canal hematoma. Disc levels: No advanced spinal canal or neural foraminal stenosis. Upper chest: No pneumothorax, pulmonary nodule or pleural effusion. Other: Normal visualized paraspinal cervical soft tissues. IMPRESSION: 1. No acute intracranial abnormality. 2. No acute fracture or static subluxation of the cervical spine. Electronically Signed   By: Deatra Robinson M.D.   On: 06/22/2021 03:02   DG Chest Port 1 View  Result Date: 06/22/2021 CLINICAL DATA:  Seizure EXAM: PORTABLE CHEST 1 VIEW COMPARISON:  None.  FINDINGS: The heart size and mediastinal contours are within normal limits. Both lungs are clear. The visualized skeletal structures are unremarkable. IMPRESSION: No active disease. Electronically Signed   By: Deatra Robinson M.D.   On: 06/22/2021 02:33    Procedures .Critical Care Performed by: Zadie Rhine, MD Authorized by: Zadie Rhine, MD   Critical care  provider statement:    Critical care time (minutes):  89   Critical care start time:  06/22/2021 2:11 AM   Critical care end time:  06/22/2021 3:30 AM   Critical care time was exclusive of:  Separately billable procedures and treating other patients   Critical care was necessary to treat or prevent imminent or life-threatening deterioration of the following conditions:  CNS failure or compromise, toxidrome and metabolic crisis   Critical care was time spent personally by me on the following activities:  Development of treatment plan with patient or surrogate, evaluation of patient's response to treatment, examination of patient, re-evaluation of patient's condition, ordering and review of radiographic studies, ordering and performing treatments and interventions, ordering and review of laboratory studies, pulse oximetry and review of old charts   I assumed direction of critical care for this patient from another provider in my specialty: no     Care discussed with: admitting provider     Medications Ordered in ED Medications  acetaminophen (TYLENOL) tablet 650 mg (has no administration in time range)    Or  acetaminophen (TYLENOL) suppository 650 mg (has no administration in time range)  lactated ringers infusion (has no administration in time range)  LORazepam (ATIVAN) injection 1 mg (has no administration in time range)  lactated ringers 1,000 mL with thiamine 100 mg, folic acid 1 mg, M.V.I. Adult 10 mL infusion (has no administration in time range)  LORazepam (ATIVAN) 2 MG/ML injection (2 mg  Given 06/22/21 0201)  lactated ringers bolus 2,000 mL (2,000 mLs Intravenous New Bag/Given 06/22/21 0231)  LORazepam (ATIVAN) injection 1 mg (1 mg Intravenous Given 06/22/21 0257)    ED Course  I have reviewed the triage vital signs and the nursing notes.  Pertinent labs & imaging results that were available during my care of the patient were reviewed by me and considered in my medical decision making  (see chart for details).    MDM Rules/Calculators/A&P                           This patient presents to the ED for concern of drug overdose/seizures, this involves an extensive number of treatment options, and is a complaint that carries with it a high risk of complications and morbidity.  The differential diagnosis includes metabolic derangement, intracranial hemorrhage, stroke, toxidrome   Lab Tests:  I Ordered, reviewed, and interpreted labs, which included electrolytes, tox panel, alcohol level, complete blood count  Medicines ordered:  I ordered medication Ativan and IV fluid for seizures  Imaging Studies ordered:  I ordered imaging studies which included CT head/C-spine and chest x-ray  I independently visualized and interpreted imaging which showed no acute findings.  No intracranial hemorrhage  Additional history obtained:   Previous records obtained and reviewed   Consultations Obtained:  I consulted [facility] hospitalist Dr. Marilynn Rail and discussed lab and imaging findings  Reevaluation:  After the interventions stated above, I reevaluated the patient and found patient is improved  Critical Interventions:  2:30 AM Patient more awake and alert this time.  Imaging and labs are pending at this time 2:57  AM Patient reports heavy alcohol abuse recently.  Also reports ingesting unknown pills.  Patient is tremulous and tachycardic.  Given that he had a seizure, will presume this could be an alcohol withdrawal seizure Ativan ordered 5:01 AM Patient started become more tachycardic and tremulous.  Ativan has been ordered. Overall labs reveal rhabdomyolysis and presence of amphetamines.  No signs of anion gap, his salicylate and Tylenol levels are negative Suspect this could have been alcohol withdrawal seizure versus induced by sympathomimetic  Final Clinical Impression(s) / ED Diagnoses Final diagnoses:  Seizure (HCC)  Polysubstance abuse (HCC)  Non-traumatic  rhabdomyolysis    Rx / DC Orders ED Discharge Orders     None        Zadie Rhine, MD 06/22/21 [phone]

## 2021-06-23 ENCOUNTER — Encounter (HOSPITAL_COMMUNITY): Payer: Self-pay | Admitting: Pulmonary Disease

## 2021-06-23 LAB — CBC
HCT: 39.6 % (ref 39.0–52.0)
Hemoglobin: 13.7 g/dL (ref 13.0–17.0)
MCH: 32.5 pg (ref 26.0–34.0)
MCHC: 34.6 g/dL (ref 30.0–36.0)
MCV: 94.1 fL (ref 80.0–100.0)
Platelets: 143 10*3/uL — ABNORMAL LOW (ref 150–400)
RBC: 4.21 MIL/uL — ABNORMAL LOW (ref 4.22–5.81)
RDW: 16 % — ABNORMAL HIGH (ref 11.5–15.5)
WBC: 6.1 10*3/uL (ref 4.0–10.5)
nRBC: 0 % (ref 0.0–0.2)

## 2021-06-23 LAB — PHOSPHORUS: Phosphorus: 2.9 mg/dL (ref 2.5–4.6)

## 2021-06-23 LAB — BASIC METABOLIC PANEL
Anion gap: 10 (ref 5–15)
Anion gap: 11 (ref 5–15)
BUN: <5 mg/dL — ABNORMAL LOW (ref 6–20)
BUN: <5 mg/dL — ABNORMAL LOW (ref 6–20)
CO2: 22 mmol/L (ref 22–32)
CO2: 23 mmol/L (ref 22–32)
Calcium: 8.8 mg/dL — ABNORMAL LOW (ref 8.9–10.3)
Calcium: 9 mg/dL (ref 8.9–10.3)
Chloride: 99 mmol/L (ref 98–111)
Chloride: 96 mmol/L — ABNORMAL LOW (ref 98–111)
Creatinine, Ser: 0.86 mg/dL (ref 0.61–1.24)
Creatinine, Ser: 0.81 mg/dL (ref 0.61–1.24)
GFR, Estimated: >60 mL/min (ref >60)
GFR, Estimated: >60 mL/min (ref >60)
Glucose, Bld: 95 mg/dL (ref 70–99)
Glucose, Bld: 98 mg/dL (ref 70–99)
Potassium: 3.3 mmol/L — ABNORMAL LOW (ref 3.5–5.1)
Potassium: 3.8 mmol/L (ref 3.5–5.1)
Sodium: 131 mmol/L — ABNORMAL LOW (ref 135–145)
Sodium: 130 mmol/L — ABNORMAL LOW (ref 135–145)

## 2021-06-23 LAB — CK: Total CK: 3675 U/L — ABNORMAL HIGH (ref 49–397)

## 2021-06-23 LAB — MAGNESIUM: Magnesium: 1.9 mg/dL (ref 1.7–2.4)

## 2021-06-23 MED ORDER — FOLIC ACID 1 MG PO TABS
1.0000 mg | ORAL_TABLET | Freq: Every day | ORAL | Status: DC
  Administered 2021-06-23 – 2021-06-24 (×2): 1 mg via ORAL
  Filled 2021-06-23 (×2): qty 1

## 2021-06-23 MED ORDER — ONDANSETRON 4 MG PO TBDP: 4.0000 mg | ORAL_TABLET | Freq: Four times a day (QID) | ORAL | Status: DC | PRN

## 2021-06-23 MED ORDER — LOPERAMIDE HCL 2 MG PO CAPS: 2.0000 mg | ORAL_CAPSULE | ORAL | Status: DC | PRN

## 2021-06-23 MED ORDER — CHLORDIAZEPOXIDE HCL 25 MG PO CAPS: 25.0000 mg | ORAL_CAPSULE | Freq: Three times a day (TID) | ORAL | Status: DC

## 2021-06-23 MED ORDER — MAGNESIUM SULFATE 2 GM/50ML IV SOLN: 2.0000 g | Freq: Once | INTRAVENOUS | Status: DC

## 2021-06-23 MED ORDER — HYDRALAZINE HCL 20 MG/ML IJ SOLN
10.0000 mg | INTRAMUSCULAR | Status: DC | PRN
  Administered 2021-06-23 (×2): 20 mg via INTRAVENOUS
  Filled 2021-06-23 (×2): qty 1

## 2021-06-23 MED ORDER — HYDROXYZINE HCL 25 MG PO TABS
25.0000 mg | ORAL_TABLET | Freq: Four times a day (QID) | ORAL | Status: DC | PRN
  Administered 2021-06-23 – 2021-06-24 (×3): 25 mg via ORAL
  Filled 2021-06-23 (×3): qty 1

## 2021-06-23 MED ORDER — CHLORDIAZEPOXIDE HCL 25 MG PO CAPS
50.0000 mg | ORAL_CAPSULE | Freq: Once | ORAL | Status: AC
  Administered 2021-06-23: 50 mg via ORAL
  Filled 2021-06-23: qty 2

## 2021-06-23 MED ORDER — POTASSIUM CHLORIDE 10 MEQ/100ML IV SOLN
10.0000 meq | INTRAVENOUS | Status: AC
  Administered 2021-06-23 (×4): 10 meq via INTRAVENOUS
  Filled 2021-06-23 (×4): qty 100

## 2021-06-23 MED ORDER — POTASSIUM CHLORIDE CRYS ER 20 MEQ PO TBCR
20.0000 meq | EXTENDED_RELEASE_TABLET | ORAL | Status: AC
  Administered 2021-06-23 (×2): 20 meq via ORAL
  Filled 2021-06-23 (×2): qty 1

## 2021-06-23 MED ORDER — CHLORDIAZEPOXIDE HCL 25 MG PO CAPS
25.0000 mg | ORAL_CAPSULE | Freq: Four times a day (QID) | ORAL | Status: DC
  Administered 2021-06-23 (×3): 25 mg via ORAL
  Filled 2021-06-23 (×3): qty 1

## 2021-06-23 MED ORDER — CHLORDIAZEPOXIDE HCL 25 MG PO CAPS: 25.0000 mg | ORAL_CAPSULE | Freq: Every day | ORAL | Status: DC

## 2021-06-23 MED ORDER — MAGNESIUM SULFATE 2 GM/50ML IV SOLN
2.0000 g | Freq: Once | INTRAVENOUS | Status: AC
  Administered 2021-06-23: 2 g via INTRAVENOUS
  Filled 2021-06-23: qty 50

## 2021-06-23 MED ORDER — CHLORDIAZEPOXIDE HCL 25 MG PO CAPS: 25.0000 mg | ORAL_CAPSULE | ORAL | Status: DC

## 2021-06-23 MED ORDER — LORAZEPAM 2 MG/ML IJ SOLN
0.5000 mg | Freq: Once | INTRAMUSCULAR | Status: AC
  Administered 2021-06-23: 0.5 mg via INTRAVENOUS
  Filled 2021-06-23: qty 1

## 2021-06-23 NOTE — Progress Notes (Signed)
Brief Progress Note Called to bedside by RN for patient's continued agitation and getting OOB. Wanting to leave AMA.  He continues to have hallucinations. He is currently on Precedex ar 1.8 mcg Librium withdrawal Protocol has been initiated.  With continued elevated CK and hallucinations and recent seizures  patient does need to remain hospitalized. Plan 12 Lead EKG now>> QT is 456 ms and QTC is 484 QT is prolonged on EKG, patient is currently calm. Will continue to monitor, will not add Haldol at present.  Bevelyn Ngo, MSN, AGACNP-BC [place] [facility] See Amion for personal pager PCCM on call pager [phone]    Additional 10 minutes ICU eval time

## 2021-06-23 NOTE — Progress Notes (Signed)
eLink Physician-Brief Progress Note Patient Name: Ryan Graham DOB: 1991/02/12 MRN: [id number]   Date of Service  06/23/2021  HPI/Events of Note  Patient needs restraints renewed for safety.  eICU Interventions  Restraints order  renewed.        Jonae Renshaw U Irene Mitcham 06/23/2021, 1:13 AM

## 2021-06-23 NOTE — Progress Notes (Addendum)
NAME:  Ryan Graham, MRN:  [id number], DOB:  03/15/1991, LOS: 1 ADMISSION DATE:  06/22/2021, CONSULTATION DATE:  12/5 REFERRING MD:  Dr. Bebe Shaggy, CHIEF COMPLAINT:  seizure (possible alcohol withdrawal)   History of Present Illness:  30 year old male with no PMH presenting to [facility] ED on 12/5 after being found down in hotel and apparently hitting his head.  Patient was limited to provide information.  He admitted to snorting cocaine and taking a handful of unknown pills.  He reported feeling shaky.  While in triage patient noted to have 2 seizures that lasted about 1 minute each.  Patient has been given Ativan with improvement.  Unknown alcohol history; states he drinks a few beers.  Patient is tachycardic.  UDS positive for amphetamines.  Alcohol, acetaminophen, salicylate level normal.  Head CT/spine normal.  CIWA protocol in place.  Patient requiring multiple doses of Ativan and becoming very agitated.  Starting on Precedex drip.  PCCM consulted for ICU admission while on Precedex drip.  Pertinent  Medical History  History reviewed. No pertinent past medical history.   Significant Hospital Events: Including procedures, antibiotic start and stop dates in addition to other pertinent events   12/5 Admitted to [facility] for seizure likely due to alcohol withdrawal; moving to ICU to start on precedex drip  Interim History / Subjective:  Pt. States he is hungry. Girlfriend in room with patient  T Max 99 On RA  Precedex infusion at 1.8 mcg RN reports pt waxing / waning from sleeping to standing at side of bed, easily redirectable but not at baseline, impulsive Requiring Ativan Q 2 in addition to Precedex for agitation/ withdrawal  K 3.3, Mag 1.7 yesterday, will recheck lab today and replete as needed CK 3.675 down from 4,812 Na 131, BUN < 5, Creatinine 0.86  Hallucinating this am>> see's a man in the corner  Objective   Blood pressure (!) 155/102, pulse 63, temperature 98.2 F (36.8  C), temperature source Oral, resp. rate 16, height 6\' 3"  (1.905 m), weight 70.4 kg, SpO2 97 %.        Intake/Output Summary (Last 24 hours) at 06/23/2021 14/12/2020 Last data filed at 06/23/2021 0600 Gross per 24 hour  Intake 3789.84 ml  Output 5850 ml  Net -2060.16 ml   Filed Weights   06/22/21 0228 06/23/21 0437  Weight: 63.5 kg 70.4 kg    Examination: General: disheveled adult male lying in bed in NAD , Hallucinating HEENT: MM pink/moist, no JVD, anicteric , nose piercing Neuro: awake on precedex, moves all extremities spontaneously , impulsive CV: s1s2 RRR, no m/r/g PULM: non-labored at rest amd on RA, Bilateral chest excursion,  lungs  clear GI: soft, bsx4 active , NT, slightly distended Extremities: warm/dry, no edema, dirty feet, nails  Skin: no rashes or lesions, intact, multiple tattoos   Resolved Hospital Problem list     Assessment & Plan:   Seizures  Polysubstance abuse Possible alcohol withdrawal Acute Metabolic Encephalopathy  Hallucinating UDS positive for amphetamines; CT head, cervical spine negative. S/p multiple doses of ativan without significant improvement.  -monitor in ICU -continue precedex infusion >> wean as able  -seizure precautions - Trend Mag -avoid beta blockers with cocaine use  -continue thiamine, folate, MVI  -CIWA>> Currently requiring Ativan Q 2  - Will need to be offered rehab as an OP - Librium taper started for withdrawal with goal of weaning precedex  Elevated LFTs Mild Thrombocytopenia -assess acute hepatitis panel >> Non-reactive for Hep B, Hep A ,  Hep C -follow CBC , platelets with slight improvement 12/6 -supportive care   Rhabdomyolysis - Suspect in setting of volume depletion, seizure, unknown downtime  - continue IVF resuscitation, LR at 150 ml/hr for now ( Adding po's so hope to decrease rate in next 24 hours if CK's continue to decline)  - follow up CK in am   Hypokalemia Mag repleted 12/5 -replaced, follow at  1400 - Re-check mag and replete as needed - Will check Phos  Nutrition Plan Bedside Swallow Regular Diet  Best Practice (right click and "Reselect all SmartList Selections" daily)  Diet/type: NPO DVT prophylaxis: LMWH GI prophylaxis: N/A Lines: N/A Foley:  N/A Code Status:  full code Last date of multidisciplinary goals of care discussion: Pending.  Called Blanchie Serve, no answer at [phone]. Will update family on arrival.   Critical care time: 38 minutes    Bevelyn Ngo, MSN, AGACNP-BC [facility] Pulmonary/Critical Care Medicine See Amion for pager details PCCM on call pager [phone]  06/23/2021, 9:28 AM   Please see [email or web address] for pager details.   From 7A-7P if no response, please call [phone] After hours, please call ELink [phone]

## 2021-06-23 NOTE — Progress Notes (Signed)
[facility] ADULT ICU REPLACEMENT PROTOCOL   The patient does apply for the [facility] Adult ICU Electrolyte Replacment Protocol based on the criteria listed below:   1.Exclusion criteria: TCTS patients, ECMO patients, and Dialysis patients 2. Is GFR >/= 30 ml/min? Yes.    Patient's GFR today is >60 3. Is SCr </= 2? Yes.   Patient's SCr is 0.86 mg/dL 4. Did SCr increase >/= 0.5 in 24 hours? No. 5.Pt's weight >40kg  Yes.   6. Abnormal electrolyte(s): K 3.3     7. Electrolytes replaced per protocol 8.  Call MD STAT for K+ </= 2.5, Phos </= 1, or Mag </= 1 Physician:    Markus Daft A 06/23/2021 6:47 AM

## 2021-06-24 LAB — PHOSPHORUS: Phosphorus: 3.7 mg/dL (ref 2.5–4.6)

## 2021-06-24 LAB — CBC WITH DIFFERENTIAL/PLATELET
Abs Immature Granulocytes: 0.01 10*3/uL (ref 0.00–0.07)
Basophils Absolute: 0 10*3/uL (ref 0.0–0.1)
Basophils Relative: 0 %
Eosinophils Absolute: 0.1 10*3/uL (ref 0.0–0.5)
Eosinophils Relative: 2 %
HCT: 40.5 % (ref 39.0–52.0)
Hemoglobin: 14.2 g/dL (ref 13.0–17.0)
Immature Granulocytes: 0 %
Lymphocytes Relative: 28 %
Lymphs Abs: 1.3 10*3/uL (ref 0.7–4.0)
MCH: 32.7 pg (ref 26.0–34.0)
MCHC: 35.1 g/dL (ref 30.0–36.0)
MCV: 93.3 fL (ref 80.0–100.0)
Monocytes Absolute: 0.6 10*3/uL (ref 0.1–1.0)
Monocytes Relative: 12 %
Neutro Abs: 2.8 10*3/uL (ref 1.7–7.7)
Neutrophils Relative %: 58 %
Platelets: 149 10*3/uL — ABNORMAL LOW (ref 150–400)
RBC: 4.34 MIL/uL (ref 4.22–5.81)
RDW: 16.1 % — ABNORMAL HIGH (ref 11.5–15.5)
WBC: 4.8 10*3/uL (ref 4.0–10.5)
nRBC: 0 % (ref 0.0–0.2)

## 2021-06-24 LAB — BASIC METABOLIC PANEL
Anion gap: 9 (ref 5–15)
BUN: <5 mg/dL — ABNORMAL LOW (ref 6–20)
CO2: 24 mmol/L (ref 22–32)
Calcium: 9 mg/dL (ref 8.9–10.3)
Chloride: 99 mmol/L (ref 98–111)
Creatinine, Ser: 0.8 mg/dL (ref 0.61–1.24)
GFR, Estimated: >60 mL/min (ref >60)
Glucose, Bld: 109 mg/dL — ABNORMAL HIGH (ref 70–99)
Potassium: 3.8 mmol/L (ref 3.5–5.1)
Sodium: 132 mmol/L — ABNORMAL LOW (ref 135–145)

## 2021-06-24 LAB — HEPATIC FUNCTION PANEL
ALT: 51 U/L — ABNORMAL HIGH (ref 0–44)
AST: 100 U/L — ABNORMAL HIGH (ref 15–41)
Albumin: 3.3 g/dL — ABNORMAL LOW (ref 3.5–5.0)
Alkaline Phosphatase: 96 U/L (ref 38–126)
Bilirubin, Direct: <0.1 mg/dL (ref 0.0–0.2)
Total Bilirubin: 0.4 mg/dL (ref 0.3–1.2)
Total Protein: 6.7 g/dL (ref 6.5–8.1)

## 2021-06-24 LAB — MAGNESIUM: Magnesium: 1.8 mg/dL (ref 1.7–2.4)

## 2021-06-24 LAB — CK: Total CK: 3379 U/L — ABNORMAL HIGH (ref 49–397)

## 2021-06-24 MED ORDER — POTASSIUM CHLORIDE 20 MEQ PO PACK: 40.0000 meq | PACK | Freq: Once | ORAL | Status: DC

## 2021-06-24 MED ORDER — NICOTINE POLACRILEX 2 MG MT GUM
2.0000 mg | CHEWING_GUM | OROMUCOSAL | Status: DC | PRN
  Administered 2021-06-24: 2 mg via ORAL
  Filled 2021-06-24 (×3): qty 1

## 2021-06-24 MED ORDER — PHENOBARBITAL 32.4 MG PO TABS: 32.4000 mg | ORAL_TABLET | Freq: Three times a day (TID) | ORAL | Status: DC

## 2021-06-24 MED ORDER — POTASSIUM CHLORIDE CRYS ER 20 MEQ PO TBCR
40.0000 meq | EXTENDED_RELEASE_TABLET | Freq: Once | ORAL | Status: AC
  Administered 2021-06-24: 40 meq via ORAL
  Filled 2021-06-24: qty 2

## 2021-06-24 MED ORDER — MAGNESIUM SULFATE 2 GM/50ML IV SOLN
2.0000 g | Freq: Once | INTRAVENOUS | Status: AC
  Administered 2021-06-24: 2 g via INTRAVENOUS
  Filled 2021-06-24: qty 50

## 2021-06-24 MED ORDER — PHENOBARBITAL 32.4 MG PO TABS
97.2000 mg | ORAL_TABLET | Freq: Three times a day (TID) | ORAL | Status: DC
  Administered 2021-06-24 (×2): 97.2 mg via ORAL
  Filled 2021-06-24 (×2): qty 3

## 2021-06-24 MED ORDER — PHENOBARBITAL 32.4 MG PO TABS: 64.8000 mg | ORAL_TABLET | Freq: Three times a day (TID) | ORAL | Status: DC

## 2021-06-24 NOTE — Plan of Care (Signed)
  Problem: Safety: Goal: Non-violent Restraint(s) Outcome: Not Progressing   Problem: Education: Goal: Knowledge of General Education information will improve Description: Including pain rating scale, medication(s)/side effects and non-pharmacologic comfort measures Outcome: Not Progressing   Problem: Health Behavior/Discharge Planning: Goal: Ability to manage health-related needs will improve Outcome: Not Progressing   Problem: Clinical Measurements: Goal: Ability to maintain clinical measurements within normal limits will improve Outcome: Not Progressing Goal: Will remain free from infection Outcome: Not Progressing Goal: Diagnostic test results will improve Outcome: Not Progressing Goal: Respiratory complications will improve Outcome: Not Progressing Goal: Cardiovascular complication will be avoided Outcome: Not Progressing   Problem: Activity: Goal: Risk for activity intolerance will decrease Outcome: Not Progressing   Problem: Nutrition: Goal: Adequate nutrition will be maintained Outcome: Not Progressing   Problem: Coping: Goal: Level of anxiety will decrease Outcome: Not Progressing   Problem: Elimination: Goal: Will not experience complications related to bowel motility Outcome: Not Progressing Goal: Will not experience complications related to urinary retention Outcome: Not Progressing   Problem: Pain Managment: Goal: General experience of comfort will improve Outcome: Not Progressing   Problem: Safety: Goal: Ability to remain free from injury will improve Outcome: Not Progressing   Problem: Skin Integrity: Goal: Risk for impaired skin integrity will decrease Outcome: Not Progressing   

## 2021-06-24 NOTE — Progress Notes (Signed)
Patient became agitated and started to rip off medical devices. Patient stated that he was leaving. This RN explained the risks of leaving against medical advice. Patient signed AMA papers.Belongings were collected.  IVs were taken out and Gaynell Face, MD was made aware.

## 2021-06-24 NOTE — Progress Notes (Signed)
NAME:  Ryan Graham, MRN:  [id number], DOB:  Jan 30, 1991, LOS: 2 ADMISSION DATE:  06/22/2021, CONSULTATION DATE:  12/5 REFERRING MD:  Dr. Bebe Shaggy, CHIEF COMPLAINT:  seizure (possible alcohol withdrawal)   History of Present Illness:  30 year old male with no PMH presenting to [facility] ED on 12/5 after being found down in hotel and apparently hitting his head.  Patient was limited to provide information.  He admitted to snorting cocaine and taking a handful of unknown pills.  He reported feeling shaky.  While in triage patient noted to have 2 seizures that lasted about 1 minute each.  Patient has been given Ativan with improvement.  Unknown alcohol history; states he drinks a few beers.  Patient is tachycardic.  UDS positive for amphetamines.  Alcohol, acetaminophen, salicylate level normal.  Head CT/spine normal.  CIWA protocol in place.  Patient requiring multiple doses of Ativan and becoming very agitated.  Starting on Precedex drip.  PCCM consulted for ICU admission while on Precedex drip.  Pertinent  Medical History  History reviewed. No pertinent past medical history.   Significant Hospital Events: Including procedures, antibiotic start and stop dates in addition to other pertinent events   12/5 Admitted to [facility] for seizure likely due to alcohol withdrawal; moving to ICU to start on precedex drip 12/7 added pentobarbital to help wean off precedex  Interim History / Subjective:  Patient smoked in room overnight after girlfriend brought cigarette Still pulling leads off Still having hallucinations Precedex 1.8 mcg with prn ativan; adding pentobarbital On ra  Objective   Blood pressure (!) 156/101, pulse 96, temperature 98.3 F (36.8 C), resp. rate 16, height 6\' 3"  (1.905 m), weight 71.8 kg, SpO2 99 %.        Intake/Output Summary (Last 24 hours) at 06/24/2021 1034 Last data filed at 06/24/2021 1000 Gross per 24 hour  Intake 4856.88 ml  Output 1550 ml  Net 3306.88 ml     Filed Weights   06/22/21 0228 06/23/21 0437 06/24/21 0405  Weight: 63.5 kg 70.4 kg 71.8 kg    Examination: General:  hallucinating and pulling leads off HEENT: MM pink/moist Neuro: awake and follows commands on precedex; MAE CV: s1s2, RRR, no m/r/g PULM:  dim clear BS bilaterally; on room air GI: soft, bsx4 active  Extremities: warm/dry, no edema  Skin: no rashes or lesions   Resolved Hospital Problem list     Assessment & Plan:   Seizures  Polysubstance abuse Possible alcohol withdrawal Acute Metabolic Encephalopathy  Hallucinating UDS positive for amphetamines; CT head, cervical spine negative. S/p multiple doses of ativan without significant improvement.  P: -continue monitoring in ICU while on precedex -will add pentobarbital for withdrawal dt -continue precedex and prn ativan -continue librium -CIWA protocol in place -continue thiamine, folate, mvi  Elevated LFTs: improving Mild Thrombocytopenia: improving P: -trend CMP/CBC  Rhabdomyolysis: Suspect in setting of volume depletion, seizure, unknown downtime  P: -ck trending down -continue IV fluids 150 ml/hr -continue oral hydration as tolerated -ck in am  Hypokalemia Hypomagnesemia P: -repleted K/mag -repeat BMP 2000 -trend and replete for K >4 and mag >2  Hx of tobacco abuse P: -nicotine patch  Nutrition P: -continue regular diet  Best Practice (right click and "Reselect all SmartList Selections" daily)  Diet/type: NPO DVT prophylaxis: LMWH GI prophylaxis: N/A Lines: N/A Foley:  N/A Code Status:  full code Last date of multidisciplinary goals of care discussion: 12/7 updated girlfriend and patient at bedside   Critical care time: 32 minutes  JD Anselm Lis [place] Pul[facility] & [facility] 06/24/2021, 11:01 AM  Please see [email or web address] for pager details.  From 7A-7P if no response, please call [phone]. After hours, please call ELink [phone].

## 2021-06-24 NOTE — Progress Notes (Signed)
Mg 1.8 Replaced per protocol  

## 2021-06-24 NOTE — Plan of Care (Signed)
  Problem: Safety: Goal: Non-violent Restraint(s) 06/24/2021 0912 by Rocky Morel, RN Outcome: Completed/Met 06/24/2021 0911 by Rocky Morel, RN Outcome: Not Progressing

## 2021-06-24 NOTE — Progress Notes (Signed)
Patient was found smoking in his room at this time and was made aware that he cannot smoke in his room writer removed lighter from the room and threw remaining cigarette in trash. Patient has been educated on O2 within the room and the dangers of smoking. Patient also made aware that if family member brings him in cigarettes or any other substance he will not be allowed visitation for remainder of ICU stay.

## 2021-06-25 NOTE — Discharge Summary (Signed)
Physician Discharge Summary  Patient ID: Ryan Graham MRN: [id number] DOB/AGE: [birth year] 28 y.o.  Admit date: 06/22/2021 Discharge date: 06/25/2021  Admission Diagnoses:  Discharge Diagnoses:  Principal Problem:   Seizures (HCC) Active Problems:   Alcohol withdrawal [facility])   Discharged Condition: fair  Hospital Course: Patient is a 30 year old male with no PMH presenting to [facility] ED on 12/5 after being found down in hotel and hitting his head.  Patient was limited to provide information.  He admitted to snorting cocaine and taking a handful of unknown pills.  He reported feeling shaky.  While in triage patient noted to have 2 seizures that lasted about 1 minute each.  Patient has been given Ativan with improvement.  Unknown alcohol history; states he drinks a few beers.  Patient is tachycardic.  UDS positive for amphetamines.  Alcohol, acetaminophen, salicylate level normal.  Head CT/spine normal.  CIWA protocol in place.  Patient requiring multiple doses of Ativan and becoming very agitated.  Starting on Precedex drip.   PCCM consulted for ICU admission while on Precedex drip.   Pt began to withdrawal from chronic substance abuse with some mild hallucinations and req increasing agents to help with this including librium, ativan, phenobarb and precedex. His ck was elevated on presentation and while had yet to clear at the time of his leaving against medical advice was on the downward trend.   We were able to wean down the precedex, and stop the librium prior to his ama departure. He was orientedx4 and able to make his own decisions although certainly lacks insight into his long term health issues should this lifestyle continue. His girlfriend was present and continued to provide him with support. Pt signed out AMA.   Consults:  see chart  Significant Diagnostic Studies: see chart  Treatments: see chart  Discharge Exam: Blood pressure (!) 146/88, pulse 85, temperature 99 F (37.2  C), resp. rate 20, height 6\' 3"  (1.905 m), weight 71.8 kg, SpO2 100 %. General:  In bed in nad visiting with family  HENT: NCAT eomi, perrla, mmmp PULM: CTA B CV: RRR, no mgr GI: BS+, soft, nontender, nondistended MSK: normal bulk and tone Neuro: awake alert oriented x4 Disposition:   home with girlfriend Allergies as of 06/24/2021   No Known Allergies      Medication List    You have not been prescribed any medications.      Signed: 14/01/2021 06/25/2021, 1:05 PM

## 2021-08-11 ENCOUNTER — Ambulatory Visit: Payer: Self-pay | Admitting: Family Medicine

## 2021-08-20 ENCOUNTER — Emergency Department
Admission: EM | Admit: 2021-08-20 | Discharge: 2021-08-20 | Disposition: A | Discharge status: Home / Self Care | Payer: Self-pay | Attending: Emergency Medicine | Admitting: Emergency Medicine

## 2021-08-20 ENCOUNTER — Encounter: Payer: Self-pay | Admitting: Emergency Medicine

## 2021-08-20 ENCOUNTER — Other Ambulatory Visit: Payer: Self-pay

## 2021-08-20 DIAGNOSIS — R112 Nausea with vomiting, unspecified: Secondary | ICD-10-CM | POA: Insufficient documentation

## 2021-08-20 DIAGNOSIS — A549 Gonococcal infection, unspecified: Secondary | ICD-10-CM | POA: Insufficient documentation

## 2021-08-20 LAB — URINALYSIS, COMPLETE (UACMP) WITH MICROSCOPIC
Bilirubin Urine: NEGATIVE
Glucose, UA: NEGATIVE mg/dL
Nitrite: NEGATIVE
Protein, ur: 30 mg/dL — AB
Specific Gravity, Urine: <1.005 — ABNORMAL LOW (ref 1.005–1.030)
Squamous Epithelial / HPF: NONE SEEN (ref 0–5)
WBC, UA: >50 WBC/hpf (ref 0–5)
pH: 7 (ref 5.0–8.0)

## 2021-08-20 LAB — CHLAMYDIA/NGC RT PCR (ARMC ONLY)
Chlamydia Tr: NOT DETECTED
N gonorrhoeae: DETECTED — AB

## 2021-08-20 LAB — URINE DRUG SCREEN, QUALITATIVE (ARMC ONLY)
Amphetamines, Ur Screen: NOT DETECTED
Barbiturates, Ur Screen: NOT DETECTED
Benzodiazepine, Ur Scrn: NOT DETECTED
Cannabinoid 50 Ng, Ur ~~LOC~~: NOT DETECTED
Cocaine Metabolite,Ur ~~LOC~~: NOT DETECTED
MDMA (Ecstasy)Ur Screen: NOT DETECTED
Methadone Scn, Ur: NOT DETECTED
Opiate, Ur Screen: NOT DETECTED
Phencyclidine (PCP) Ur S: NOT DETECTED
Tricyclic, Ur Screen: NOT DETECTED

## 2021-08-20 MED ORDER — LORAZEPAM 1 MG PO TABS
1.0000 mg | ORAL_TABLET | Freq: Two times a day (BID) | ORAL | 0 refills | Status: AC | PRN
Start: 2021-08-20 — End: 2021-08-23

## 2021-08-20 MED ORDER — AZITHROMYCIN 500 MG PO TABS
1000.0000 mg | ORAL_TABLET | Freq: Once | ORAL | Status: AC
  Administered 2021-08-20: 1000 mg via ORAL
  Filled 2021-08-20: qty 2

## 2021-08-20 MED ORDER — ONDANSETRON 4 MG PO TBDP
4.0000 mg | ORAL_TABLET | Freq: Once | ORAL | Status: AC
  Administered 2021-08-20: 4 mg via ORAL
  Filled 2021-08-20: qty 1

## 2021-08-20 MED ORDER — LORAZEPAM 1 MG PO TABS
2.0000 mg | ORAL_TABLET | Freq: Once | ORAL | Status: AC
  Administered 2021-08-20: 2 mg via ORAL
  Filled 2021-08-20: qty 2

## 2021-08-20 MED ORDER — CEFTRIAXONE SODIUM 250 MG IJ SOLR
250.0000 mg | Freq: Once | INTRAMUSCULAR | Status: AC
  Administered 2021-08-20: 250 mg via INTRAMUSCULAR
  Filled 2021-08-20: qty 250

## 2021-08-20 MED ORDER — LIDOCAINE HCL (PF) 1 % IJ SOLN
5.0000 mL | Freq: Once | INTRAMUSCULAR | Status: AC
  Administered 2021-08-20: 5 mL
  Filled 2021-08-20: qty 5

## 2021-08-20 NOTE — ED Provider Notes (Signed)
[facility] Provider Note  Patient Contact: 7:46 AM (approximate)   History   Nausea and Dysuria (/)   HPI  Ryan Graham is a 31 y.o. male with medical history including seizure activity, alcohol withdrawal, polysubstance abuse, presents to the ED via EMS from home.  Patient reports episode yesterday of high heart rate.  He subsequently took a pill that he thought was an antibiotic, felt no relief.  He admits to alcohol intake with his last drink this morning at about 5 AM.  He denies any chest pain, shortness of breath, or fevers.  He does endorse some nausea without vomiting.  Patient also has a secondary, unrelated complaint of dysuria for the last 4 months.  He is concerned for possible STD and is requesting testing at this time.   Physical Exam   Triage Vital Signs: ED Triage Vitals [08/20/21 0743]  Enc Vitals Group     BP      Pulse      Resp      Temp      Temp src      SpO2      Weight 150 lb (68 kg)     Height 6\' 2"  (1.88 m)     Head Circumference      Peak Flow      Pain Score 0     Pain Loc      Pain Edu?      Excl. in GC?     Most recent vital signs: Vitals:   08/20/21 0900 08/20/21 0942  BP: 123/68 127/77  Pulse: 73 90  Resp: 17 18  Temp:    SpO2:  94%     General: Alert and in no acute distress. Head: No acute traumatic findings Neck: No stridor. No cervical spine tenderness to palpation. Cardiovascular:  Good peripheral perfusion Respiratory: Normal respiratory effort without tachypnea or retractions. Lungs CTAB.  Gastrointestinal: Bowel sounds 4 quadrants. Soft and nontender to palpation. No guarding or rigidity. No palpable masses. No distention. No CVA tenderness. Musculoskeletal: Full range of motion to all extremities.  Neurologic:  No gross focal neurologic deficits are appreciated.  Skin:   No rash noted Other:   ED Results / Procedures / Treatments   Labs (all labs ordered are listed, but only  abnormal results are displayed) Labs Reviewed  CHLAMYDIA/NGC RT PCR ([facility] ONLY)           - Abnormal; Notable for the following components:      Result Value   N gonorrhoeae DETECTED (*)    All other components within normal limits  URINALYSIS, COMPLETE (UACMP) WITH MICROSCOPIC - Abnormal; Notable for the following components:   Specific Gravity, Urine <1.005 (*)    Hgb urine dipstick MODERATE (*)    Ketones, ur TRACE (*)    Protein, ur 30 (*)    Leukocytes,Ua LARGE (*)    Bacteria, UA RARE (*)    All other components within normal limits  URINE DRUG SCREEN, QUALITATIVE ([facility] ONLY)     EKG   RADIOLOGY  No results found.  PROCEDURES:  Critical Care performed: No  Procedures   MEDICATIONS ORDERED IN ED: Medications  LORazepam (ATIVAN) tablet 2 mg (2 mg Oral Given 08/20/21 0808)  ondansetron (ZOFRAN-ODT) disintegrating tablet 4 mg (4 mg Oral Given 08/20/21 0808)  cefTRIAXone (ROCEPHIN) injection 250 mg (250 mg Intramuscular Given 08/20/21 1021)  azithromycin (ZITHROMAX) tablet 1,000 mg (1,000 mg Oral Given 08/20/21 1020)  lidocaine (PF) (  XYLOCAINE) 1 % injection 5 mL (5 mLs Infiltration Given 08/20/21 1022)     IMPRESSION / MDM / ASSESSMENT AND PLAN / ED COURSE  I reviewed the triage vital signs and the nursing notes.                              Differential diagnosis includes, but is not limited to, alcohol withdrawal, alcohol intoxication, substance abuse disorder, dysuria, STD  Patient presents to the ED via EMS from home, with complaints of taking an unknown pill prior to arrival, and continued alcohol intake.  Patient presented with some anxiety and some hypervigilance that was improved with an oral dose of Ativan.  No CIWA evidence of acute DT or alcohol-related withdrawal.  Urinalysis showed white blood cell clumps, which were consistent with the patient's ultimate diagnosis of gonococcal urethritis.  Patient was treated empirically with azithromycin and Rocephin in the  ED.  Patient remain on the monitor throughout his course in the ED, and had no abnormal heart rate findings, hypoxia, tachycardia, or malignant arrhythmias.  Patient stable at this time for discharge and outpatient management.  A small prescription for Ativan (6) are provided for his benefit.  Patient is to follow up with local community clinic or health department as needed or otherwise directed. Patient is given ED precautions to return to the ED for any worsening or new symptoms.    FINAL CLINICAL IMPRESSION(S) / ED DIAGNOSES   Final diagnoses:  Gonorrhea     Rx / DC Orders   ED Discharge Orders          Ordered    LORazepam (ATIVAN) 1 MG tablet  2 times daily PRN        08/20/21 1048             Note:  This document was prepared using Dragon voice recognition software and may include unintentional dictation errors.    Lissa Hoard, PA-C 08/20/21 1450    Minna Antis, MD 08/20/21 [phone]

## 2021-08-20 NOTE — Discharge Instructions (Signed)
You have been treated for an STD with a single dose of antibiotics given in the ED.  You should avoid any sexual contact for at least 1 week.  All partners should be tested, treated, and symptom free, before you engage in intimate activity.

## 2021-08-20 NOTE — ED Triage Notes (Signed)
Presents via EMS from home  states he developed some "rapid heart rate" yesterday  states then he took a "white pill"  then started drinking ETOH  last drink was around 5 am  also thinks he may have a STD

## 2021-08-25 ENCOUNTER — Other Ambulatory Visit: Payer: Self-pay

## 2021-08-25 ENCOUNTER — Emergency Department
Admission: EM | Admit: 2021-08-25 | Discharge: 2021-08-25 | Disposition: A | Discharge status: Home / Self Care | Payer: Self-pay | Attending: Emergency Medicine | Admitting: Emergency Medicine

## 2021-08-25 DIAGNOSIS — Z79899 Other long term (current) drug therapy: Secondary | ICD-10-CM

## 2021-08-25 DIAGNOSIS — F419 Anxiety disorder, unspecified: Secondary | ICD-10-CM | POA: Insufficient documentation

## 2021-08-25 DIAGNOSIS — Z760 Encounter for issue of repeat prescription: Secondary | ICD-10-CM | POA: Insufficient documentation

## 2021-08-25 MED ORDER — HYDROXYZINE HCL 10 MG PO TABS: 10.0000 mg | ORAL_TABLET | Freq: Three times a day (TID) | ORAL | 0 refills | Status: AC | PRN

## 2021-08-25 NOTE — ED Triage Notes (Signed)
Pt presents with prescription for ativan from 08/20/21 when seen here for anxiety. Reports he would like it refilled until he can be seen by another provider outpatient.  Last meth use 2 months ago

## 2021-08-25 NOTE — Discharge Instructions (Signed)
You can take hydroxyzine up to 3 times daily for anxiety 

## 2021-08-25 NOTE — ED Provider Notes (Signed)
[facility] Provider Note  Patient Contact: 5:25 PM (approximate)   History   No chief complaint on file.   HPI  Ryan Graham is a 31 y.o. male presents to the emergency department for refill of Ativan.  Patient states that he was prescribed Ativan on 08/20/2021 for anxiety and states it is worked well for him.  He states that he would like to establish care with a psychiatrist that he can receive medication chronically.  States he does not have an appointment with a psychiatrist yet.      Physical Exam   Triage Vital Signs: ED Triage Vitals [08/25/21 1644]  Enc Vitals Group     BP 115/79     Pulse Rate 83     Resp 20     Temp 98.3 F (36.8 C)     Temp Source Oral     SpO2 100 %     Weight 149 lb 14.6 oz (68 kg)     Height 6\' 2"  (1.88 m)     Head Circumference      Peak Flow      Pain Score 0     Pain Loc      Pain Edu?      Excl. in GC?     Most recent vital signs: Vitals:   08/25/21 1644  BP: 115/79  Pulse: 83  Resp: 20  Temp: 98.3 F (36.8 C)  SpO2: 100%     General: Alert and in no acute distress. Eyes:  PERRL. EOMI. Head: No acute traumatic findings      Ears:       Nose: No congestion/rhinnorhea.      Mouth/Throat: Mucous membranes are moist. Cardiovascular:  Good peripheral perfusion Respiratory: Normal respiratory effort without tachypnea or retractions. Lungs CTAB. Good air entry to the bases with no decreased or absent breath sounds. Gastrointestinal: Bowel sounds 4 quadrants. Soft and nontender to palpation. No guarding or rigidity. No palpable masses. No distention. No CVA tenderness. Musculoskeletal: Full range of motion to all extremities.  Neurologic:  No gross focal neurologic deficits are appreciated.  Skin:   No rash noted Other:   ED Results / Procedures / Treatments   Labs (all labs ordered are listed, but only abnormal results are displayed) Labs Reviewed - No data to  display       PROCEDURES:  Critical Care performed: No  Procedures   MEDICATIONS ORDERED IN ED: Medications - No data to display   IMPRESSION / MDM / ASSESSMENT AND PLAN / ED COURSE  I reviewed the triage vital signs and the nursing notes.                              Differential diagnosis includes, but is not limited to, medication management  Assessment and plan Medication management 31 year old male presents to the emergency department for refill of Ativan.  Explained to patient that we could not chronically prescribe Ativan and the emergency department.  Patient was transition to hydroxyzine until he can be seen by psychiatry.  He voiced understanding and feels comfortable with this plan.  All patient questions were answered.      FINAL CLINICAL IMPRESSION(S) / ED DIAGNOSES   Final diagnoses:  Medication management     Rx / DC Orders   ED Discharge Orders          Ordered    hydrOXYzine (ATARAX) 10 MG tablet  3 times daily PRN        08/25/21 1721             Note:  This document was prepared using Dragon voice recognition software and may include unintentional dictation errors.   Pia Mau Shell, PA-C 08/25/21 1729    Sharman Cheek, MD 08/25/21 [phone]

## 2021-09-05 ENCOUNTER — Other Ambulatory Visit: Payer: Self-pay

## 2021-09-05 ENCOUNTER — Emergency Department: Payer: Self-pay

## 2021-09-05 ENCOUNTER — Emergency Department
Admission: EM | Admit: 2021-09-05 | Discharge: 2021-09-05 | Disposition: A | Discharge status: Home / Self Care | Payer: Self-pay | Attending: Emergency Medicine | Admitting: Emergency Medicine

## 2021-09-05 DIAGNOSIS — Z79899 Other long term (current) drug therapy: Secondary | ICD-10-CM | POA: Insufficient documentation

## 2021-09-05 DIAGNOSIS — R7309 Other abnormal glucose: Secondary | ICD-10-CM | POA: Insufficient documentation

## 2021-09-05 DIAGNOSIS — Y908 Blood alcohol level of 240 mg/100 ml or more: Secondary | ICD-10-CM | POA: Insufficient documentation

## 2021-09-05 DIAGNOSIS — F10920 Alcohol use, unspecified with intoxication, uncomplicated: Secondary | ICD-10-CM

## 2021-09-05 DIAGNOSIS — R4182 Altered mental status, unspecified: Secondary | ICD-10-CM | POA: Insufficient documentation

## 2021-09-05 DIAGNOSIS — F10120 Alcohol abuse with intoxication, uncomplicated: Secondary | ICD-10-CM | POA: Insufficient documentation

## 2021-09-05 LAB — COMPREHENSIVE METABOLIC PANEL
ALT: 68 U/L — ABNORMAL HIGH (ref 0–44)
AST: 97 U/L — ABNORMAL HIGH (ref 15–41)
Albumin: 4.5 g/dL (ref 3.5–5.0)
Alkaline Phosphatase: 95 U/L (ref 38–126)
Anion gap: 14 (ref 5–15)
BUN: 9 mg/dL (ref 6–20)
CO2: 24 mmol/L (ref 22–32)
Calcium: 8.8 mg/dL — ABNORMAL LOW (ref 8.9–10.3)
Chloride: 95 mmol/L — ABNORMAL LOW (ref 98–111)
Creatinine, Ser: 0.97 mg/dL (ref 0.61–1.24)
GFR, Estimated: >60 mL/min (ref >60)
Glucose, Bld: 106 mg/dL — ABNORMAL HIGH (ref 70–99)
Potassium: 3.4 mmol/L — ABNORMAL LOW (ref 3.5–5.1)
Sodium: 133 mmol/L — ABNORMAL LOW (ref 135–145)
Total Bilirubin: 0.4 mg/dL (ref 0.3–1.2)
Total Protein: 8.3 g/dL — ABNORMAL HIGH (ref 6.5–8.1)

## 2021-09-05 LAB — CBC WITH DIFFERENTIAL/PLATELET
Abs Immature Granulocytes: 0.03 10*3/uL (ref 0.00–0.07)
Basophils Absolute: 0.1 10*3/uL (ref 0.0–0.1)
Basophils Relative: 1 %
Eosinophils Absolute: 0 10*3/uL (ref 0.0–0.5)
Eosinophils Relative: 0 %
HCT: 37 % — ABNORMAL LOW (ref 39.0–52.0)
Hemoglobin: 12.6 g/dL — ABNORMAL LOW (ref 13.0–17.0)
Immature Granulocytes: 0 %
Lymphocytes Relative: 35 %
Lymphs Abs: 3 10*3/uL (ref 0.7–4.0)
MCH: 31.8 pg (ref 26.0–34.0)
MCHC: 34.1 g/dL (ref 30.0–36.0)
MCV: 93.4 fL (ref 80.0–100.0)
Monocytes Absolute: 0.6 10*3/uL (ref 0.1–1.0)
Monocytes Relative: 7 %
Neutro Abs: 4.9 10*3/uL (ref 1.7–7.7)
Neutrophils Relative %: 57 %
Platelets: 269 10*3/uL (ref 150–400)
RBC: 3.96 MIL/uL — ABNORMAL LOW (ref 4.22–5.81)
RDW: 13.1 % (ref 11.5–15.5)
WBC: 8.6 10*3/uL (ref 4.0–10.5)
nRBC: 0 % (ref 0.0–0.2)

## 2021-09-05 LAB — URINE DRUG SCREEN, QUALITATIVE (ARMC ONLY)
Amphetamines, Ur Screen: NOT DETECTED
Barbiturates, Ur Screen: NOT DETECTED
Benzodiazepine, Ur Scrn: NOT DETECTED
Cannabinoid 50 Ng, Ur ~~LOC~~: NOT DETECTED
Cocaine Metabolite,Ur ~~LOC~~: NOT DETECTED
MDMA (Ecstasy)Ur Screen: NOT DETECTED
Methadone Scn, Ur: NOT DETECTED
Opiate, Ur Screen: NOT DETECTED
Phencyclidine (PCP) Ur S: NOT DETECTED
Tricyclic, Ur Screen: NOT DETECTED

## 2021-09-05 LAB — CBG MONITORING, ED: Glucose-Capillary: 106 mg/dL — ABNORMAL HIGH (ref 70–99)

## 2021-09-05 LAB — MAGNESIUM: Magnesium: 1.9 mg/dL (ref 1.7–2.4)

## 2021-09-05 LAB — ACETAMINOPHEN LEVEL: Acetaminophen (Tylenol), Serum: <10 ug/mL — ABNORMAL LOW (ref 10–30)

## 2021-09-05 LAB — SALICYLATE LEVEL: Salicylate Lvl: <7 mg/dL — ABNORMAL LOW (ref 7.0–30.0)

## 2021-09-05 LAB — ETHANOL: Alcohol, Ethyl (B): 312 mg/dL (ref <10)

## 2021-09-05 MED ORDER — POTASSIUM CHLORIDE CRYS ER 20 MEQ PO TBCR
40.0000 meq | EXTENDED_RELEASE_TABLET | Freq: Once | ORAL | Status: AC
  Administered 2021-09-05: 40 meq via ORAL
  Filled 2021-09-05: qty 2

## 2021-09-05 MED ORDER — LORAZEPAM 2 MG/ML IJ SOLN: 0.0000 mg | Freq: Four times a day (QID) | INTRAMUSCULAR | Status: DC

## 2021-09-05 MED ORDER — NEPHRO-VITE RX 1 MG PO TABS: 1.0000 | ORAL_TABLET | Freq: Every day | ORAL | 3 refills | Status: AC

## 2021-09-05 MED ORDER — LORAZEPAM 2 MG PO TABS: 0.0000 mg | ORAL_TABLET | Freq: Four times a day (QID) | ORAL | Status: DC

## 2021-09-05 MED ORDER — SODIUM CHLORIDE 0.9 % IV SOLN: INTRAVENOUS | Status: DC

## 2021-09-05 MED ORDER — THIAMINE HCL 100 MG/ML IJ SOLN
100.0000 mg | Freq: Once | INTRAMUSCULAR | Status: AC
  Administered 2021-09-05: 100 mg via INTRAVENOUS
  Filled 2021-09-05: qty 2

## 2021-09-05 MED ORDER — THIAMINE HCL 100 MG/ML IJ SOLN: 100.0000 mg | Freq: Every day | INTRAMUSCULAR | Status: DC

## 2021-09-05 MED ORDER — THIAMINE HCL 100 MG PO TABS: 100.0000 mg | ORAL_TABLET | Freq: Every day | ORAL | Status: DC

## 2021-09-05 MED ORDER — SODIUM CHLORIDE 0.9 % IV BOLUS
1000.0000 mL | Freq: Once | INTRAVENOUS | Status: AC
  Administered 2021-09-05: 1000 mL via INTRAVENOUS

## 2021-09-05 MED ORDER — LORAZEPAM 2 MG PO TABS: 0.0000 mg | ORAL_TABLET | Freq: Two times a day (BID) | ORAL | Status: DC

## 2021-09-05 MED ORDER — THIAMINE HCL 100 MG PO TABS: 100.0000 mg | ORAL_TABLET | Freq: Every day | ORAL | 3 refills | Status: AC

## 2021-09-05 MED ORDER — LORAZEPAM 2 MG/ML IJ SOLN: 0.0000 mg | Freq: Two times a day (BID) | INTRAMUSCULAR | Status: DC

## 2021-09-05 NOTE — ED Notes (Signed)
Pt ambulated in room. Pt states he felt dizzy, but no different than when he came in

## 2021-09-05 NOTE — ED Triage Notes (Signed)
Pt comes from home via [facility] after taking 2 1/2 percocets and also drinking alcohol. EMS states that pt was exhibiting odd behavior and was all over the place upon arrival. Pt now resting, with no complaints. Pt states he has fallen several times. Pt A&Ox4.

## 2021-09-05 NOTE — Discharge Instructions (Addendum)
Steps to find a Primary Care Provider (PCP):  Call [phone] or [phone] to access " Find a Doctor Service."  2.  You may also go on the  website at [email or web address]  

## 2021-09-05 NOTE — ED Notes (Signed)
Patient transported to CT 

## 2021-09-05 NOTE — ED Provider Notes (Addendum)
[facility] Provider Note    Event Date/Time   First MD Initiated Contact with Patient 09/05/21 [phone]     (approximate)   History   Drug Overdose   HPI  Ryan Graham is a 31 y.o. male with no significant past medical history who presents to the emergency department EMS for concerns for intoxication and potential overdose.  EMS reports that he was acting abnormally with them but states that his mental status has been improving.  Patient states he has been drinking alcohol the day and took 2-1/2 tablets of what he thinks was Percocet around midnight.  States he was trying to get "high" and denies SI or that he was trying to hurt himself.  He states because he was intoxicated he had multiple falls and is complaining of headache.  No neck or back pain.  No numbness, tingling or weakness.  No chest or abdominal pain.  Denies any other coingestions.  Hemodynamically stable with EMS.  Blood glucose normal with EMS.  Patient admits to drinking alcohol every day.   History provided by patient and EMS.    History reviewed. No pertinent past medical history.  History reviewed. No pertinent surgical history.  MEDICATIONS:  Prior to Admission medications   Not on File    Physical Exam   Triage Vital Signs: ED Triage Vitals  Enc Vitals Group     BP 09/05/21 0236 140/84     Pulse Rate 09/05/21 0236 (!) 113     Resp 09/05/21 0236 (!) 23     Temp 09/05/21 0236 98.4 F (36.9 C)     Temp Source 09/05/21 0236 Oral     SpO2 09/05/21 0235 100 %     Weight 09/05/21 0237 140 lb (63.5 kg)     Height 09/05/21 0237 6\' 1"  (1.854 m)     Head Circumference --      Peak Flow --      Pain Score 09/05/21 0237 0     Pain Loc --      Pain Edu? --      Excl. in [place]? --     Most recent vital signs: Vitals:   09/05/21 0500 09/05/21 0530  BP: 136/77 124/70  Pulse: 88 82  Resp: 15   Temp:    SpO2: 92% 90%     CONSTITUTIONAL: Alert and oriented x 4 and responds  appropriately to questions. Well-appearing; well-nourished; GCS 15 HEAD: Normocephalic; atraumatic EYES: Conjunctivae clear, PERRL, EOMI ENT: normal nose; no rhinorrhea; moist mucous membranes; pharynx without lesions noted; no dental injury; no septal hematoma, no epistaxis; no facial deformity or bony tenderness NECK: Supple, no midline spinal tenderness, step-off or deformity; trachea midline CARD: Regular and tachycardic; S1 and S2 appreciated; no murmurs, no clicks, no rubs, no gallops RESP: Normal chest excursion without splinting or tachypnea; breath sounds clear and equal bilaterally; no wheezes, no rhonchi, no rales; no hypoxia or respiratory distress CHEST:  chest wall stable, no crepitus or ecchymosis or deformity, nontender to palpation; no flail chest ABD/GI: Normal bowel sounds; non-distended; soft, non-tender, no rebound, no guarding; no ecchymosis or other lesions noted PELVIS:  stable, nontender to palpation BACK:  The back appears normal; no midline spinal tenderness, step-off or deformity EXT: Normal ROM in all joints; non-tender to palpation; no edema; normal capillary refill; no cyanosis, no bony tenderness or bony deformity of patient's extremities, no joint effusion, compartments are soft, extremities are warm and well-perfused, no ecchymosis SKIN: Normal color  for age and race; warm NEURO: No facial asymmetry, normal speech, moving all extremities equally PSYCH: Calm, cooperative, acting appropriately.  No SI.  ED Results / Procedures / Treatments   LABS: (all labs ordered are listed, but only abnormal results are displayed) Labs Reviewed  COMPREHENSIVE METABOLIC PANEL - Abnormal; Notable for the following components:      Result Value   Sodium 133 (*)    Potassium 3.4 (*)    Chloride 95 (*)    Glucose, Bld 106 (*)    Calcium 8.8 (*)    Total Protein 8.3 (*)    AST 97 (*)    ALT 68 (*)    All other components within normal limits  SALICYLATE LEVEL - Abnormal;  Notable for the following components:   Salicylate Lvl [id number] (*)    All other components within normal limits  ACETAMINOPHEN LEVEL - Abnormal; Notable for the following components:   Acetaminophen (Tylenol), Serum <10 (*)    All other components within normal limits  ETHANOL - Abnormal; Notable for the following components:   Alcohol, Ethyl (B) 312 (*)    All other components within normal limits  CBC WITH DIFFERENTIAL/PLATELET - Abnormal; Notable for the following components:   RBC 3.96 (*)    Hemoglobin 12.6 (*)    HCT 37.0 (*)    All other components within normal limits  CBG MONITORING, ED - Abnormal; Notable for the following components:   Glucose-Capillary 106 (*)    All other components within normal limits  URINE DRUG SCREEN, QUALITATIVE ([facility] ONLY)  MAGNESIUM     EKG:  EKG Interpretation  Date/Time:  Saturday September 05 2021 02:32:16 EST Ventricular Rate:  93 PR Interval:  145 QRS Duration: 104 QT Interval:  361 QTC Calculation: 449 R Axis:   112 Text Interpretation: Sinus rhythm RSR' in V1 or V2, probably normal variant ST elevation, consider lateral injury Confirmed by Pryor Curia [phone]) on 09/05/2021 3:00:31 AM          RADIOLOGY: My personal review and interpretation of imaging: CT head and cervical spine show no acute traumatic injury.  I have personally reviewed all radiology reports. CT HEAD WO CONTRAST  Result Date: 09/05/2021 CLINICAL DATA:  Altered mental status. EXAM: CT HEAD WITHOUT CONTRAST TECHNIQUE: Contiguous axial images were obtained from the base of the skull through the vertex without intravenous contrast. RADIATION DOSE REDUCTION: This exam was performed according to the departmental dose-optimization program which includes automated exposure control, adjustment of the mA and/or kV according to patient size and/or use of iterative reconstruction technique. COMPARISON:  June 22, 2021 FINDINGS: Brain: No evidence of acute infarction,  hemorrhage, hydrocephalus, extra-axial collection or mass lesion/mass effect. Vascular: No hyperdense vessel or unexpected calcification. Skull: Normal. Negative for fracture or focal lesion. Sinuses/Orbits: No acute finding. Other: None. IMPRESSION: No acute intracranial pathology. Electronically Signed   By: Virgina Norfolk M.D.   On: 09/05/2021 03:09   CT Cervical Spine Wo Contrast  Result Date: 09/05/2021 CLINICAL DATA:  Altered mental status. EXAM: CT CERVICAL SPINE WITHOUT CONTRAST TECHNIQUE: Multidetector CT imaging of the cervical spine was performed without intravenous contrast. Multiplanar CT image reconstructions were also generated. RADIATION DOSE REDUCTION: This exam was performed according to the departmental dose-optimization program which includes automated exposure control, adjustment of the mA and/or kV according to patient size and/or use of iterative reconstruction technique. COMPARISON:  June 22, 2021 FINDINGS: Alignment: There is reversal of the normal cervical spine lordosis. Skull base and vertebrae:  No acute fracture. No primary bone lesion or focal pathologic process. Soft tissues and spinal canal: No prevertebral fluid or swelling. No visible canal hematoma. Disc levels: Normal multilevel endplates are seen with normal multilevel intervertebral disc spaces. Normal, bilateral multilevel facet joints are noted. Upper chest: Negative. Other: None. IMPRESSION: No acute fracture or subluxation of the cervical spine. Electronically Signed   By: Aram Candela M.D.   On: 09/05/2021 03:11     PROCEDURES:  Critical Care performed: No   CRITICAL CARE Performed by: Baxter Hire [employer]   Total critical care time: 0 minutes  Critical care time was exclusive of separately billable procedures and treating other patients.  Critical care was necessary to treat or prevent imminent or life-threatening deterioration.  Critical care was time spent personally by me on the following  activities: development of treatment plan with patient and/or surrogate as well as nursing, discussions with consultants, evaluation of patient's response to treatment, examination of patient, obtaining history from patient or surrogate, ordering and performing treatments and interventions, ordering and review of laboratory studies, ordering and review of radiographic studies, pulse oximetry and re-evaluation of patient's condition.   Marland Kitchen1-3 Lead EKG Interpretation Performed by: Latrisa Hellums, Layla Maw, DO Authorized by: Brodie Correll, Layla Maw, DO     Interpretation: abnormal     ECG rate:  110   ECG rate assessment: tachycardic     Rhythm: sinus tachycardia     Ectopy: none     Conduction: normal      IMPRESSION / MDM / ASSESSMENT AND PLAN / ED COURSE  I reviewed the triage vital signs and the nursing notes.  Patient here intoxicated with altered mental status per EMS that has improved.  The patient is on the cardiac monitor to evaluate for evidence of arrhythmia and/or significant heart rate changes.   DIFFERENTIAL DIAGNOSIS (includes but not limited to):   Intoxication from alcohol and opiates, metabolic encephalopathy, hepatic encephalopathy, intracranial hemorrhage, skull fracture, cervical spine fracture, concussion.  Patient denies SI or suicide attempt today.   PLAN: We will obtain CBC, CMP, Tylenol salicylate levels, alcohol level, drug screen.  Given he is intoxicated and reports he hit his head, will obtain CT of the head and cervical spine.  We will place him on cardiac monitoring.  EKG is nonischemic.  We will continue to closely monitor.  No other sign of traumatic injury on exam.   MEDICATIONS GIVEN IN ED: Medications  sodium chloride 0.9 % bolus 1,000 mL (1,000 mLs Intravenous New Bag/Given 09/05/21 0250)    And  0.9 %  sodium chloride infusion ( Intravenous New Bag/Given 09/05/21 0355)  LORazepam (ATIVAN) injection 0-4 mg (0 mg Intravenous Not Given 09/05/21 0439)    Or  LORazepam  (ATIVAN) tablet 0-4 mg ( Oral See Alternative 09/05/21 0439)  LORazepam (ATIVAN) injection 0-4 mg (has no administration in time range)    Or  LORazepam (ATIVAN) tablet 0-4 mg (has no administration in time range)  thiamine tablet 100 mg (has no administration in time range)    Or  thiamine (B-1) injection 100 mg (has no administration in time range)  thiamine (B-1) injection 100 mg (100 mg Intravenous Given 09/05/21 0414)  potassium chloride SA (KLOR-CON M) CR tablet 40 mEq (40 mEq Oral Given 09/05/21 0415)     ED COURSE: I have reviewed patient's labs.  No leukocytosis.  Normal hemoglobin.  Alcohol level of 312.  Negative Tylenol and salicylate levels.  Electrolytes within normal limits other than slightly low potassium.  Will give oral replacement.  Mild elevation of AST and ALT which could be from drug use and appears to be stable compared to labs in December 2022.  Blood glucose is normal.  I have reviewed the CT head and cervical spine imaging and shows no acute traumatic injury.  We will continue to monitor until clinically sober.  Drug screen pending.   Patient's urine drug screen is negative.  He is still neurologically intact, hemodynamically stable.  He is trying to find a ride home.  I have placed him on a CIWA protocol but he has no signs of withdrawal currently.  He will need a sober driver to take him home.  At this time, I do not feel there is any life-threatening condition present. I reviewed all nursing notes, vitals, pertinent previous records.  All lab and urine results, EKGs, imaging ordered have been independently reviewed and interpreted by myself.  I reviewed all available radiology reports from any imaging ordered this visit.  Based on my assessment, I feel the patient is safe to be discharged home without further emergent workup and can continue workup as an outpatient as needed. Discussed all findings, treatment plan as well as usual and customary return precautions with  patient.  They verbalize understanding and are comfortable with this plan.  Outpatient follow-up has been provided as needed.  All questions have been answered.   6:13 AM  Pt unable to get a ride from any of his family members because they are stating that he needs help with his alcohol abuse.  Patient is requesting help going to rehab.  We will place TTS consult.  He is here voluntarily.  Does not need emergent psychiatric evaluation.  CONSULTS: 4:36 AM  No admission needed at this time given work-up is reassuring, patient neurologically intact and hemodynamically stable without signs of alcohol withdrawal.   TTS consult placed for further disposition.   6:36 AM  Spoke with Vaughan Browner, counselor with TTS.  No TTS available today.  She would not be back to see the patient until later tonight.  Given patient's history of alcohol withdrawal seizure they would not be able to place him into a rehab facility.  I have offered him outpatient resources which he agrees to.  I am not convinced that he is ready for rehab and to stop drinking as it seems that it is more his family pushing for this and therefore I do not feel comfortable giving him a Librium taper especially given he abuses prescription narcotics.  Provided with outpatient resources.  Patient comfortable with plan for discharge.  Still no current signs of withdrawal.  OUTSIDE RECORDS REVIEWED: Reviewed patient's previous admission on 06/22/2021 to 06/25/2021 where he was admitted to the hospital for an alcohol withdrawal seizure.  Drug screen was also positive for amphetamines during that admission.         FINAL CLINICAL IMPRESSION(S) / ED DIAGNOSES   Final diagnoses:  Alcoholic intoxication without complication ([place])     Rx / DC Orders   ED Discharge Orders          Ordered    B Complex-C-Folic Acid (B COMPLEX-VITAMIN C-FOLIC ACID) 1 MG tablet  Daily with breakfast        09/05/21 0437    thiamine 100 MG tablet  Daily         09/05/21 0437             Note:  This document was prepared using Dragon voice recognition  software and may include unintentional dictation errors.      Koren Sermersheim, Delice Bison, DO 09/05/21 [phone]

## 2021-11-30 ENCOUNTER — Emergency Department
Admission: EM | Admit: 2021-11-30 | Discharge: 2021-11-30 | Disposition: A | Discharge status: Home / Self Care | Payer: Self-pay | Attending: Emergency Medicine | Admitting: Emergency Medicine

## 2021-11-30 ENCOUNTER — Encounter: Payer: Self-pay | Admitting: Emergency Medicine

## 2021-11-30 ENCOUNTER — Other Ambulatory Visit: Payer: Self-pay

## 2021-11-30 DIAGNOSIS — F10230 Alcohol dependence with withdrawal, uncomplicated: Secondary | ICD-10-CM | POA: Insufficient documentation

## 2021-11-30 DIAGNOSIS — R748 Abnormal levels of other serum enzymes: Secondary | ICD-10-CM | POA: Insufficient documentation

## 2021-11-30 DIAGNOSIS — F1010 Alcohol abuse, uncomplicated: Secondary | ICD-10-CM

## 2021-11-30 DIAGNOSIS — F10930 Alcohol use, unspecified with withdrawal, uncomplicated: Secondary | ICD-10-CM

## 2021-11-30 DIAGNOSIS — R109 Unspecified abdominal pain: Secondary | ICD-10-CM | POA: Insufficient documentation

## 2021-11-30 DIAGNOSIS — R7401 Elevation of levels of liver transaminase levels: Secondary | ICD-10-CM | POA: Insufficient documentation

## 2021-11-30 DIAGNOSIS — Y909 Presence of alcohol in blood, level not specified: Secondary | ICD-10-CM | POA: Insufficient documentation

## 2021-11-30 LAB — CBC WITH DIFFERENTIAL/PLATELET
Abs Immature Granulocytes: 0.01 10*3/uL (ref 0.00–0.07)
Basophils Absolute: 0.1 10*3/uL (ref 0.0–0.1)
Basophils Relative: 1 %
Eosinophils Absolute: 0 10*3/uL (ref 0.0–0.5)
Eosinophils Relative: 0 %
HCT: 40.6 % (ref 39.0–52.0)
Hemoglobin: 13.8 g/dL (ref 13.0–17.0)
Immature Granulocytes: 0 %
Lymphocytes Relative: 71 %
Lymphs Abs: 3.7 10*3/uL (ref 0.7–4.0)
MCH: 31.3 pg (ref 26.0–34.0)
MCHC: 34 g/dL (ref 30.0–36.0)
MCV: 92.1 fL (ref 80.0–100.0)
Monocytes Absolute: 0.3 10*3/uL (ref 0.1–1.0)
Monocytes Relative: 5 %
Neutro Abs: 1.2 10*3/uL — ABNORMAL LOW (ref 1.7–7.7)
Neutrophils Relative %: 23 %
Platelets: 101 10*3/uL — ABNORMAL LOW (ref 150–400)
RBC: 4.41 MIL/uL (ref 4.22–5.81)
RDW: 15.9 % — ABNORMAL HIGH (ref 11.5–15.5)
Smear Review: NORMAL
WBC: 5.3 10*3/uL (ref 4.0–10.5)
nRBC: 0 % (ref 0.0–0.2)

## 2021-11-30 LAB — URINALYSIS, ROUTINE W REFLEX MICROSCOPIC
Bacteria, UA: NONE SEEN
Bilirubin Urine: NEGATIVE
Glucose, UA: NEGATIVE mg/dL
Ketones, ur: NEGATIVE mg/dL
Leukocytes,Ua: NEGATIVE
Nitrite: NEGATIVE
Protein, ur: 30 mg/dL — AB
Specific Gravity, Urine: 1.012 (ref 1.005–1.030)
Squamous Epithelial / HPF: NONE SEEN (ref 0–5)
pH: 5 (ref 5.0–8.0)

## 2021-11-30 LAB — COMPREHENSIVE METABOLIC PANEL
ALT: 197 U/L — ABNORMAL HIGH (ref 0–44)
AST: 248 U/L — ABNORMAL HIGH (ref 15–41)
Albumin: 4.8 g/dL (ref 3.5–5.0)
Alkaline Phosphatase: 126 U/L (ref 38–126)
Anion gap: 12 (ref 5–15)
BUN: 7 mg/dL (ref 6–20)
CO2: 26 mmol/L (ref 22–32)
Calcium: 8.5 mg/dL — ABNORMAL LOW (ref 8.9–10.3)
Chloride: 99 mmol/L (ref 98–111)
Creatinine, Ser: 0.83 mg/dL (ref 0.61–1.24)
GFR, Estimated: >60 mL/min (ref >60)
Glucose, Bld: 233 mg/dL — ABNORMAL HIGH (ref 70–99)
Potassium: 3.7 mmol/L (ref 3.5–5.1)
Sodium: 137 mmol/L (ref 135–145)
Total Bilirubin: 0.6 mg/dL (ref 0.3–1.2)
Total Protein: 8.4 g/dL — ABNORMAL HIGH (ref 6.5–8.1)

## 2021-11-30 LAB — LIPASE, BLOOD: Lipase: 66 U/L — ABNORMAL HIGH (ref 11–51)

## 2021-11-30 LAB — PATHOLOGIST SMEAR REVIEW

## 2021-11-30 LAB — MAGNESIUM: Magnesium: 2.1 mg/dL (ref 1.7–2.4)

## 2021-11-30 MED ORDER — LORAZEPAM 1 MG PO TABS
1.0000 mg | ORAL_TABLET | Freq: Once | ORAL | Status: AC
  Administered 2021-11-30: 1 mg via ORAL
  Filled 2021-11-30: qty 1

## 2021-11-30 MED ORDER — ONDANSETRON 4 MG PO TBDP
4.0000 mg | ORAL_TABLET | Freq: Once | ORAL | Status: AC
  Administered 2021-11-30: 4 mg via ORAL
  Filled 2021-11-30: qty 1

## 2021-11-30 MED ORDER — CHLORDIAZEPOXIDE HCL 25 MG PO CAPS
25.0000 mg | ORAL_CAPSULE | Freq: Once | ORAL | Status: AC
  Administered 2021-11-30: 25 mg via ORAL
  Filled 2021-11-30: qty 1

## 2021-11-30 MED ORDER — CHLORDIAZEPOXIDE HCL 25 MG PO CAPS: 25.0000 mg | ORAL_CAPSULE | Freq: Three times a day (TID) | ORAL | 0 refills | Status: DC | PRN

## 2021-11-30 NOTE — ED Notes (Signed)
ED Provider at bedside. 

## 2021-11-30 NOTE — Discharge Instructions (Addendum)
You are being discharged with a taper of Librium (chlordiazepoxide) to help with alcohol withdrawals and replace the alcohol intake. ? ?Stop drinking alcohol.  Do not drink alcohol while taking this medication. ?You need to actually eat solid food every day. ? ?Day 1,  2 tablets (50 mg) every 6 hours. ?Day 2, 1 tablet (25 mg) every 6 hours. ?Day 3, 1 tablet (25 mg) every 12 hours ?Date for, 1 tablet (25 mg) at nighttime 1 time. ? ?Reach out to [facility] services to discuss any rehab options and counseling. ?

## 2021-11-30 NOTE — ED Triage Notes (Signed)
Pt to ED via POV for alcohol withdrawals. Pt states that he drinks a lot. Pt states that his last drink was last night. Pt family states that he has been shaking really bad and that they have noticed that there is an odor coming his body. Pt is in NAD.  ?

## 2021-11-30 NOTE — ED Provider Notes (Signed)
? ?[facility] ?Provider Note ? ? ? Event Date/Time  ? First MD Initiated Contact with Patient 11/30/21 1304   ?  (approximate) ? ? ?History  ? ?Abdominal Pain ? ? ?HPI ? ?Ryan Graham is a 31 y.o. male who presents to the ED for evaluation of Abdominal Pain ?  ?I reviewed DC summary from December where patient was admitted for seizures associated with alcohol abuse and withdrawals. ? ?Patient presents to the ED, accompanied by his girlfriend, for evaluation of his alcoholism and withdrawals.  Reports awaking this morning with the shakes and his girlfriend convinced him to come and get checked out.  He does report that he does want to stop drinking.  Denies any suicidal intent.  Denies any coingestions or other drugs beyond ethanol.  Denies AV hallucinations. ? ?Reports mild generalized abdominal discomfort and nausea without emesis. ? ?Physical Exam  ? ?Triage Vital Signs: ?ED Triage Vitals  ?Enc Vitals Group  ?   BP 11/30/21 1051 (!) 140/102  ?   Pulse Rate 11/30/21 1051 (!) 115  ?   Resp 11/30/21 1051 16  ?   Temp 11/30/21 1051 (!) 97.5 ?F (36.4 ?C)  ?   Temp Source 11/30/21 1051 Oral  ?   SpO2 11/30/21 1051 98 %  ?   Weight --   ?   Height 11/30/21 1053 6\' 1"  (1.854 m)  ?   Head Circumference --   ?   Peak Flow --   ?   Pain Score 11/30/21 1052 0  ?   Pain Loc --   ?   Pain Edu? --   ?   Excl. in [place]? --   ? ? ?Most recent vital signs: ?Vitals:  ? 11/30/21 1315 11/30/21 1330  ?BP: 130/83 (!) 142/93  ?Pulse: (!) 116 98  ?Resp: 20 13  ?Temp:    ?SpO2: 99% 97%  ? ? ?General: Awake, no distress.  Generalized tremulousness without focal seizure activity. ?CV:  Good peripheral perfusion.  Tachycardic and regular. ?Resp:  Normal effort.  ?Abd:  No distention.  Minimal tenderness throughout without localizing or peritoneal features. ?MSK:  No deformity noted.  ?Neuro:  No focal deficits appreciated. Cranial nerves II through XII intact ?5/5 strength and sensation in all 4  extremities ?Other:   ? ? ?ED Results / Procedures / Treatments  ? ?Labs ?(all labs ordered are listed, but only abnormal results are displayed) ?Labs Reviewed  ?COMPREHENSIVE METABOLIC PANEL - Abnormal; Notable for the following components:  ?    Result Value  ? Glucose, Bld 233 (*)   ? Calcium 8.5 (*)   ? Total Protein 8.4 (*)   ? AST 248 (*)   ? ALT 197 (*)   ? All other components within normal limits  ?CBC WITH DIFFERENTIAL/PLATELET - Abnormal; Notable for the following components:  ? RDW 15.9 (*)   ? Platelets 101 (*)   ? Neutro Abs 1.2 (*)   ? All other components within normal limits  ?URINALYSIS, ROUTINE W REFLEX MICROSCOPIC - Abnormal; Notable for the following components:  ? Color, Urine YELLOW (*)   ? APPearance CLEAR (*)   ? Hgb urine dipstick SMALL (*)   ? Protein, ur 30 (*)   ? All other components within normal limits  ?LIPASE, BLOOD - Abnormal; Notable for the following components:  ? Lipase 66 (*)   ? All other components within normal limits  ?MAGNESIUM  ?PATHOLOGIST SMEAR REVIEW  ? ? ?  EKG ? ? ?RADIOLOGY ? ? ?Official radiology report(s): ?No results found. ? ?PROCEDURES and INTERVENTIONS: ? ?.1-3 Lead EKG Interpretation ?Performed by: Delton Prairie, MD ?Authorized by: Delton Prairie, MD  ? ?  Interpretation: abnormal   ?  ECG rate:  120 ?  ECG rate assessment: tachycardic   ?  Rhythm: sinus tachycardia   ?  Ectopy: none   ?  Conduction: normal   ? ?Medications  ?ondansetron (ZOFRAN-ODT) disintegrating tablet 4 mg (4 mg Oral Given 11/30/21 1101)  ?LORazepam (ATIVAN) tablet 1 mg (1 mg Oral Given 11/30/21 1101)  ?chlordiazePOXIDE (LIBRIUM) capsule 25 mg (25 mg Oral Given 11/30/21 1101)  ? ? ? ?IMPRESSION / MDM / ASSESSMENT AND PLAN / ED COURSE  ?I reviewed the triage vital signs and the nursing notes. ? ?31 year old alcoholic male presents to the ED and withdrawals requesting help getting through it, but not wanting psychiatric evaluation or rehab placement, suitable for trial of outpatient management  with Librium taper.  He will extremitas and withdrawing on arrival, improving clinically and dramatically with Ativan and Librium.  Subsequently tolerating p.o. intake of solid food.  Blood work with mild elevation of lipase.  CBC reassuring.  Urine without infectious features or ketonuria.  Metabolic panel with elevated LFTs that I suspect are chronic due to his drinking.  No evidence of psychiatric emergency or indications for psychiatric valuation emergently.  He wants to try to go home and detox at his girlfriend's house.  We discussed Librium taper and return precautions. ? ?Clinical Course as of 11/30/21 1422  ?Mon Nov 30, 2021  ?1315 Reassessed.  Heart rate improving.  No longer tachycardic.  Mildly tremulous.  We discussed alcohol detox. [DS]  ?  ?Clinical Course User Index ?[DS] Delton Prairie, MD  ? ? ? ?FINAL CLINICAL IMPRESSION(S) / ED DIAGNOSES  ? ?Final diagnoses:  ?Alcohol abuse  ?Alcohol withdrawal syndrome without complication ([facility])  ? ? ? ?Rx / DC Orders  ? ?ED Discharge Orders   ? ?      Ordered  ?  chlordiazePOXIDE (LIBRIUM) 25 MG capsule  3 times daily PRN       ? 11/30/21 1328  ? ?  ?  ? ?  ? ? ? ?Note:  This document was prepared using Dragon voice recognition software and may include unintentional dictation errors. ?  ?Delton Prairie, MD ?11/30/21 1600 ? ?

## 2021-11-30 NOTE — ED Provider Triage Note (Signed)
Emergency Medicine Provider Triage Evaluation Note ? ?Ryan Graham , a 31 y.o. male  was evaluated in triage.  Pt complains of feeling badly and alcohol withdrawals.  Drinks a lot every day.  Mostly just beer.  About a case per day.  Last had something last night or maybe early this morning.  Woke up shaking all over .  No seizure.. ? ?Review of Systems  ?Positive:  ?Negative:  ? ?Physical Exam  ?BP (!) 140/102 (BP Location: Left Arm)   Pulse (!) 115   Temp (!) 97.5 ?F (36.4 ?C) (Oral)   Resp 16   Ht 6\' 1"  (1.854 m)   SpO2 98%   BMI 18.47 kg/m?  ?Gen:   Awake, no distress   ?Resp:  Normal effort  ?MSK:   Moves extremities without difficulty  ?Other:   ? ?Medical Decision Making  ?Medically screening exam initiated at 10:54 AM.  Appropriate orders placed.  Ryan Graham was informed that the remainder of the evaluation will be completed by another provider, this initial triage assessment does not replace that evaluation, and the importance of remaining in the ED until their evaluation is complete. ? ?  ?Ryan Pine, MD ?11/30/21 1055 ? ?

## 2022-04-23 ENCOUNTER — Other Ambulatory Visit: Payer: Self-pay

## 2022-04-23 ENCOUNTER — Emergency Department
Admission: EM | Admit: 2022-04-23 | Discharge: 2022-04-24 | Disposition: A | Discharge status: Home / Self Care | Payer: Self-pay | Attending: Emergency Medicine | Admitting: Emergency Medicine

## 2022-04-23 DIAGNOSIS — F1010 Alcohol abuse, uncomplicated: Secondary | ICD-10-CM | POA: Insufficient documentation

## 2022-04-23 DIAGNOSIS — R251 Tremor, unspecified: Secondary | ICD-10-CM | POA: Insufficient documentation

## 2022-04-23 DIAGNOSIS — R42 Dizziness and giddiness: Secondary | ICD-10-CM | POA: Insufficient documentation

## 2022-04-23 LAB — COMPREHENSIVE METABOLIC PANEL
ALT: 72 U/L — ABNORMAL HIGH (ref 0–44)
AST: 88 U/L — ABNORMAL HIGH (ref 15–41)
Albumin: 4.5 g/dL (ref 3.5–5.0)
Alkaline Phosphatase: 114 U/L (ref 38–126)
Anion gap: 14 (ref 5–15)
BUN: 5 mg/dL — ABNORMAL LOW (ref 6–20)
CO2: 25 mmol/L (ref 22–32)
Calcium: 9.6 mg/dL (ref 8.9–10.3)
Chloride: 96 mmol/L — ABNORMAL LOW (ref 98–111)
Creatinine, Ser: 0.87 mg/dL (ref 0.61–1.24)
GFR, Estimated: >60 mL/min (ref >60)
Glucose, Bld: 97 mg/dL (ref 70–99)
Potassium: 3.8 mmol/L (ref 3.5–5.1)
Sodium: 135 mmol/L (ref 135–145)
Total Bilirubin: 1.2 mg/dL (ref 0.3–1.2)
Total Protein: 8.2 g/dL — ABNORMAL HIGH (ref 6.5–8.1)

## 2022-04-23 LAB — CBC WITH DIFFERENTIAL/PLATELET
Abs Immature Granulocytes: 0.03 10*3/uL (ref 0.00–0.07)
Basophils Absolute: 0 10*3/uL (ref 0.0–0.1)
Basophils Relative: 1 %
Eosinophils Absolute: 0 10*3/uL (ref 0.0–0.5)
Eosinophils Relative: 0 %
HCT: 40.5 % (ref 39.0–52.0)
Hemoglobin: 13.4 g/dL (ref 13.0–17.0)
Immature Granulocytes: 0 %
Lymphocytes Relative: 26 %
Lymphs Abs: 1.9 10*3/uL (ref 0.7–4.0)
MCH: 28.5 pg (ref 26.0–34.0)
MCHC: 33.1 g/dL (ref 30.0–36.0)
MCV: 86 fL (ref 80.0–100.0)
Monocytes Absolute: 0.9 10*3/uL (ref 0.1–1.0)
Monocytes Relative: 12 %
Neutro Abs: 4.4 10*3/uL (ref 1.7–7.7)
Neutrophils Relative %: 61 %
Platelets: 192 10*3/uL (ref 150–400)
RBC: 4.71 MIL/uL (ref 4.22–5.81)
RDW: 14.2 % (ref 11.5–15.5)
WBC: 7.3 10*3/uL (ref 4.0–10.5)
nRBC: 0 % (ref 0.0–0.2)

## 2022-04-23 LAB — TROPONIN I (HIGH SENSITIVITY): Troponin I (High Sensitivity): 7 ng/L (ref <18)

## 2022-04-23 LAB — LIPASE, BLOOD: Lipase: 35 U/L (ref 11–51)

## 2022-04-23 MED ORDER — LORAZEPAM 2 MG/ML IJ SOLN
2.0000 mg | Freq: Once | INTRAMUSCULAR | Status: AC
  Administered 2022-04-24: 2 mg via INTRAVENOUS
  Filled 2022-04-23: qty 1

## 2022-04-23 MED ORDER — CHLORDIAZEPOXIDE HCL 25 MG PO CAPS
25.0000 mg | ORAL_CAPSULE | Freq: Once | ORAL | Status: AC
  Administered 2022-04-24: 25 mg via ORAL
  Filled 2022-04-23: qty 1

## 2022-04-23 MED ORDER — LACTATED RINGERS IV BOLUS
1000.0000 mL | Freq: Once | INTRAVENOUS | Status: AC
  Administered 2022-04-23: 1000 mL via INTRAVENOUS

## 2022-04-23 NOTE — ED Provider Notes (Signed)
[facility] Provider Note    Event Date/Time   First MD Initiated Contact with Patient 04/23/22 2258     (approximate)   History   Dizziness   HPI  Ryan Graham is a 31 y.o. male who presents to the ED for evaluation of Dizziness   I reviewed recurrent ED visits and hospitalizations regarding alcohol abuse and complications. Reviewed [facility] DC summary from 5/23.  Patient presents to the ED for evaluation of tremulousness, dizziness and feeling unwell.  Reportedly had a very small amount of liquor earlier this morning and none since then.  He is concerned he is withdrawing.  No episodes of syncope, falls, abdominal pain, hematemesis or melena.  He is interested in detox again.  No suicidality  Physical Exam   Triage Vital Signs: ED Triage Vitals  Enc Vitals Group     BP 04/23/22 1957 134/81     Pulse Rate 04/23/22 1957 74     Resp 04/23/22 1957 18     Temp 04/23/22 1957 98.1 F (36.7 C)     Temp src --      SpO2 04/23/22 1957 100 %     Weight 04/23/22 1958 140 lb (63.5 kg)     Height --      Head Circumference --      Peak Flow --      Pain Score 04/23/22 1958 4     Pain Loc --      Pain Edu? --      Excl. in [place]? --     Most recent vital signs: Vitals:   04/24/22 0044 04/24/22 0150  BP: (!) 112/93 134/72  Pulse: 99 92  Resp: 18 18  Temp:    SpO2: 95% 95%    General: Awake, no distress.  Sitting upright, linear thoughts.  Pleasant. CV:  Good peripheral perfusion.  Resp:  Normal effort.  Abd:  No distention.  Soft and benign throughout MSK:  No deformity noted.  Neuro:  No focal deficits appreciated. Other:     ED Results / Procedures / Treatments   Labs (all labs ordered are listed, but only abnormal results are displayed) Labs Reviewed  COMPREHENSIVE METABOLIC PANEL - Abnormal; Notable for the following components:      Result Value   Chloride 96 (*)    BUN 5 (*)    Total Protein 8.2 (*)    AST 88 (*)    ALT 72  (*)    All other components within normal limits  URINALYSIS, ROUTINE W REFLEX MICROSCOPIC - Abnormal; Notable for the following components:   Color, Urine YELLOW (*)    APPearance CLEAR (*)    Ketones, ur 20 (*)    All other components within normal limits  CHLAMYDIA/NGC RT PCR ([facility] ONLY)            CBC WITH DIFFERENTIAL/PLATELET  LIPASE, BLOOD  MAGNESIUM  URINE DRUG SCREEN, QUALITATIVE ([facility] ONLY)  TROPONIN I (HIGH SENSITIVITY)  TROPONIN I (HIGH SENSITIVITY)    EKG Sinus rhythm with a rate of 74 bpm.  Normal axis and intervals.  No clear signs of acute ischemia.  Poor quality inferior tracing  RADIOLOGY   Official radiology report(s): No results found.  PROCEDURES and INTERVENTIONS:  .1-3 Lead EKG Interpretation  Performed by: Vladimir Crofts, MD Authorized by: Vladimir Crofts, MD     Interpretation: normal     ECG rate:  90   ECG rate assessment: normal  Rhythm: sinus rhythm     Ectopy: none     Conduction: normal     Medications  lactated ringers bolus 1,000 mL (0 mLs Intravenous Stopped 04/24/22 0429)  LORazepam (ATIVAN) injection 2 mg (2 mg Intravenous Given 04/24/22 0001)  chlordiazePOXIDE (LIBRIUM) capsule 25 mg (25 mg Oral Given 04/24/22 0001)  thiamine (VITAMIN B1) injection 500 mg (500 mg Intramuscular Given 04/24/22 0051)  multivitamin with minerals tablet 1 tablet (1 tablet Oral Given 04/24/22 0038)  lactated ringers bolus 1,000 mL (0 mLs Intravenous Stopped 04/24/22 0429)     IMPRESSION / MDM / ASSESSMENT AND PLAN / ED COURSE  I reviewed the triage vital signs and the nursing notes.  Differential diagnosis includes, but is not limited to, alcoholic ketoacidosis, dehydration, electrolyte derangement such as hypokalemia or hypomag, DTs, suicidal or polysubstance abuse  {Patient presents with symptoms of an acute illness or injury that is potentially life-threatening.  31 year old regular drinker presents with signs of mild withdrawals ultimately suitable  for Librium taper and outpatient rehab resources.  No signs of psychiatric emergency to require IVC or emergent psychiatric evaluation.  No signs of DTs or seizures.  His tachycardia improved with IV fluids.  Ketones are noted in his urine, suggestive of dehydration.  No anion gap or significant metabolic derangements and I doubt AKA.  Normal CBC, lipase and magnesium.  We will get him started on Librium taper and discharged with rehab resources.  Clinical Course as of 04/24/22 0434  Sat Apr 24, 2022  0132 Reassessed.  Feeling better.  No longer tremulous.  Tolerating p.o. [DS]  [id number] Patient up and ambulatory to the restroom.  Reports feeling better.  Still waiting on a ride and would likely not be for a couple hours until morning. [DS]    Clinical Course User Index [DS] Vladimir Crofts, MD     FINAL CLINICAL IMPRESSION(S) / ED DIAGNOSES   Final diagnoses:  Dizziness  Alcohol abuse     Rx / DC Orders   ED Discharge Orders          Ordered    chlordiazePOXIDE (LIBRIUM) 25 MG capsule  3 times daily PRN        04/24/22 0346             Note:  This document was prepared using Dragon voice recognition software and may include unintentional dictation errors.   Vladimir Crofts, MD 04/24/22 [phone]

## 2022-04-23 NOTE — ED Notes (Signed)
Assumed care of pt. Pt awake and alert. MD at bedside.

## 2022-04-23 NOTE — ED Notes (Signed)
ED Provider at bedside. 

## 2022-04-23 NOTE — ED Notes (Addendum)
Pt reports anxiety and is restless, hands are tremulous. Pt reports history of chronic alcohol use and withdrawal, states "I've been through this before." Pt on phone with family. Blanket offered but declined at this time. Pt denies CP, SHOB, dizziness.

## 2022-04-23 NOTE — ED Triage Notes (Signed)
Pt arrives with c/o dizziness that started today. Pt endorses nausea. Pt denies SOB.

## 2022-04-24 LAB — URINALYSIS, ROUTINE W REFLEX MICROSCOPIC
Bilirubin Urine: NEGATIVE
Glucose, UA: NEGATIVE mg/dL
Hgb urine dipstick: NEGATIVE
Ketones, ur: 20 mg/dL — AB
Leukocytes,Ua: NEGATIVE
Nitrite: NEGATIVE
Protein, ur: NEGATIVE mg/dL
Specific Gravity, Urine: 1.006 (ref 1.005–1.030)
pH: 7 (ref 5.0–8.0)

## 2022-04-24 LAB — URINE DRUG SCREEN, QUALITATIVE (ARMC ONLY)
Amphetamines, Ur Screen: NOT DETECTED
Barbiturates, Ur Screen: NOT DETECTED
Benzodiazepine, Ur Scrn: NOT DETECTED
Cannabinoid 50 Ng, Ur ~~LOC~~: NOT DETECTED
Cocaine Metabolite,Ur ~~LOC~~: NOT DETECTED
MDMA (Ecstasy)Ur Screen: NOT DETECTED
Methadone Scn, Ur: NOT DETECTED
Opiate, Ur Screen: NOT DETECTED
Phencyclidine (PCP) Ur S: NOT DETECTED
Tricyclic, Ur Screen: NOT DETECTED

## 2022-04-24 LAB — MAGNESIUM: Magnesium: 1.9 mg/dL (ref 1.7–2.4)

## 2022-04-24 LAB — CHLAMYDIA/NGC RT PCR (ARMC ONLY)
Chlamydia Tr: NOT DETECTED
N gonorrhoeae: NOT DETECTED

## 2022-04-24 LAB — TROPONIN I (HIGH SENSITIVITY): Troponin I (High Sensitivity): 8 ng/L (ref <18)

## 2022-04-24 MED ORDER — LACTATED RINGERS IV BOLUS
1000.0000 mL | Freq: Once | INTRAVENOUS | Status: AC
  Administered 2022-04-24: 1000 mL via INTRAVENOUS

## 2022-04-24 MED ORDER — THIAMINE HCL 100 MG/ML IJ SOLN
500.0000 mg | Freq: Once | INTRAMUSCULAR | Status: AC
  Administered 2022-04-24: 500 mg via INTRAMUSCULAR
  Filled 2022-04-24: qty 6

## 2022-04-24 MED ORDER — CHLORDIAZEPOXIDE HCL 25 MG PO CAPS
25.0000 mg | ORAL_CAPSULE | Freq: Once | ORAL | Status: AC
  Administered 2022-04-24: 25 mg via ORAL
  Filled 2022-04-24: qty 1

## 2022-04-24 MED ORDER — CHLORDIAZEPOXIDE HCL 25 MG PO CAPS: 25.0000 mg | ORAL_CAPSULE | Freq: Three times a day (TID) | ORAL | 0 refills | Status: DC | PRN

## 2022-04-24 MED ORDER — ADULT MULTIVITAMIN W/MINERALS CH
1.0000 | ORAL_TABLET | Freq: Once | ORAL | Status: AC
  Administered 2022-04-24: 1 via ORAL
  Filled 2022-04-24: qty 1

## 2022-04-24 NOTE — Discharge Instructions (Signed)
  Please take the Librium medication as below over the next few days.  Take as prescribed and do not drink any alcohol with this. Day 1: 2 tablets 4 times per day Day 2: 1 tablet 4 times per day Day 3: 1 tablet 2 times per day Day 4: 1 tablet at night

## 2022-06-16 ENCOUNTER — Emergency Department
Admission: EM | Admit: 2022-06-16 | Discharge: 2022-06-16 | Disposition: A | Discharge status: Home / Self Care | Payer: Medicaid Other | Attending: Emergency Medicine | Admitting: Emergency Medicine

## 2022-06-16 ENCOUNTER — Other Ambulatory Visit: Payer: Self-pay

## 2022-06-16 DIAGNOSIS — F1910 Other psychoactive substance abuse, uncomplicated: Secondary | ICD-10-CM | POA: Insufficient documentation

## 2022-06-16 DIAGNOSIS — F10130 Alcohol abuse with withdrawal, uncomplicated: Secondary | ICD-10-CM | POA: Insufficient documentation

## 2022-06-16 DIAGNOSIS — Y904 Blood alcohol level of 80-99 mg/100 ml: Secondary | ICD-10-CM | POA: Insufficient documentation

## 2022-06-16 DIAGNOSIS — F10930 Alcohol use, unspecified with withdrawal, uncomplicated: Secondary | ICD-10-CM

## 2022-06-16 LAB — COMPREHENSIVE METABOLIC PANEL
ALT: 53 U/L — ABNORMAL HIGH (ref 0–44)
AST: 72 U/L — ABNORMAL HIGH (ref 15–41)
Albumin: 4.4 g/dL (ref 3.5–5.0)
Alkaline Phosphatase: 146 U/L — ABNORMAL HIGH (ref 38–126)
Anion gap: 11 (ref 5–15)
BUN: 9 mg/dL (ref 6–20)
CO2: 23 mmol/L (ref 22–32)
Calcium: 8.7 mg/dL — ABNORMAL LOW (ref 8.9–10.3)
Chloride: 105 mmol/L (ref 98–111)
Creatinine, Ser: 0.78 mg/dL (ref 0.61–1.24)
GFR, Estimated: >60 mL/min (ref >60)
Glucose, Bld: 141 mg/dL — ABNORMAL HIGH (ref 70–99)
Potassium: 3.4 mmol/L — ABNORMAL LOW (ref 3.5–5.1)
Sodium: 139 mmol/L (ref 135–145)
Total Bilirubin: 0.6 mg/dL (ref 0.3–1.2)
Total Protein: 8.3 g/dL — ABNORMAL HIGH (ref 6.5–8.1)

## 2022-06-16 LAB — CBC
HCT: 40.5 % (ref 39.0–52.0)
Hemoglobin: 13.2 g/dL (ref 13.0–17.0)
MCH: 30.1 pg (ref 26.0–34.0)
MCHC: 32.6 g/dL (ref 30.0–36.0)
MCV: 92.3 fL (ref 80.0–100.0)
Platelets: 205 10*3/uL (ref 150–400)
RBC: 4.39 MIL/uL (ref 4.22–5.81)
RDW: 16.8 % — ABNORMAL HIGH (ref 11.5–15.5)
WBC: 10.3 10*3/uL (ref 4.0–10.5)
nRBC: 0 % (ref 0.0–0.2)

## 2022-06-16 LAB — ETHANOL: Alcohol, Ethyl (B): 83 mg/dL — ABNORMAL HIGH (ref <10)

## 2022-06-16 MED ORDER — THIAMINE HCL 100 MG/ML IJ SOLN: 100.0000 mg | Freq: Once | INTRAMUSCULAR | Status: DC

## 2022-06-16 MED ORDER — LORAZEPAM 2 MG/ML IJ SOLN: 1.0000 mg | Freq: Once | INTRAMUSCULAR | Status: DC

## 2022-06-16 MED ORDER — FOLIC ACID 1 MG PO TABS: 1.0000 mg | ORAL_TABLET | Freq: Once | ORAL | Status: DC

## 2022-06-16 MED ORDER — SODIUM CHLORIDE 0.9 % IV BOLUS: 1000.0000 mL | Freq: Once | INTRAVENOUS | Status: DC

## 2022-06-16 MED ORDER — CHLORDIAZEPOXIDE HCL 25 MG PO CAPS: ORAL_CAPSULE | ORAL | 0 refills | Status: AC

## 2022-06-16 NOTE — ED Provider Notes (Signed)
[facility] Provider Note    Event Date/Time   First MD Initiated Contact with Patient 06/16/22 1208     (approximate)   History   Weakness and Anxiety   HPI  Ryan Graham is a 31 y.o. male with history of alcohol abuse who comes in with concerns for weakness and anxiety.  Patient reports experiencing dizziness since yesterday after drinking very heavily.  He also took a Microbiologist and testosterone pill this morning and was concerned about feeling like he may pass out.  Patient reports he does not normally take testosterone that it was a one-time pill that he got over-the-counter.  He reports that he then was taking a hit of something someone he was smoking and he started feel dizzy like he was going to pass out.  He was last prescribed Librium on 10/7 which she reports that he did use and stopped drinking for a few days.  We discussed the dangers of combining the Librium with opioids and/or alcohol and he states that he did not do that.  He states that he had used it to stop drinking but then just restarted again.  He denies any falls or hitting his head.  He denies any SI.  He is seeing a food tray  Records patient was just seen on 10/6 and given a Librium taper at that time.  He was also admitted back in May 2023 to [facility] for alcohol withdrawal with mild acute alcoholic hepatitis.  Physical Exam   Triage Vital Signs: ED Triage Vitals  Enc Vitals Group     BP 06/16/22 1001 (!) 160/93     Pulse Rate 06/16/22 1001 (!) 118     Resp 06/16/22 1001 20     Temp 06/16/22 1001 97.9 F (36.6 C)     Temp Source 06/16/22 1001 Oral     SpO2 06/16/22 1001 100 %     Weight 06/16/22 1002 139 lb 15.9 oz (63.5 kg)     Height 06/16/22 1002 6\' 1"  (1.854 m)     Head Circumference --      Peak Flow --      Pain Score 06/16/22 1002 0     Pain Loc --      Pain Edu? --      Excl. in [place]? --     Most recent vital signs: Vitals:   06/16/22 1001  BP: (!) 160/93  Pulse:  (!) 118  Resp: 20  Temp: 97.9 F (36.6 C)  SpO2: 100%     General: Awake, no distress.  CV:  Good peripheral perfusion.  Resp:  Normal effort.  Abd:  No distention.  Soft and nontender Other:  No significant tremor   ED Results / Procedures / Treatments   Labs (all labs ordered are listed, but only abnormal results are displayed) Labs Reviewed  COMPREHENSIVE METABOLIC PANEL - Abnormal; Notable for the following components:      Result Value   Potassium 3.4 (*)    Glucose, Bld 141 (*)    Calcium 8.7 (*)    Total Protein 8.3 (*)    AST 72 (*)    ALT 53 (*)    Alkaline Phosphatase 146 (*)    All other components within normal limits  ETHANOL - Abnormal; Notable for the following components:   Alcohol, Ethyl (B) 83 (*)    All other components within normal limits  CBC - Abnormal; Notable for the following components:   RDW 16.8 (*)  All other components within normal limits  URINE DRUG SCREEN, QUALITATIVE ([facility] ONLY)     EKG  My interpretation of EKG:  Sinus tachycardia rate of 113 without any ST elevation or T wave versions, normal intervals   PROCEDURES:  Critical Care performed: No  Procedures   MEDICATIONS ORDERED IN ED: Medications - No data to display   IMPRESSION / MDM / [place] / ED COURSE  I reviewed the triage vital signs and the nursing notes.   Patient's presentation is most consistent with acute presentation with potential threat to life or bodily function.   Patient comes in with EtOH abuse, combining multiple medication with feeling unwell, tachycardic and near syncope suspect related to the combination of substances..  Labs ordered evaluate for Electra mellitus, AKI.  No falls or signs of trauma.  Patient CBC is reassuring with normal hemoglobin.  CMP is reassuring slightly elevated liver function test but downtrending from previous known history of alcoholic hepatitis.  Alcohol level slightly elevated at 83 at 10:00.   Abdomen is soft and nontender.    We discussed the risk of going home on Librium taper if he combines it with other medications but he is adamant that he will not.  He denies any SI.  He does report a desire to stop drinking and would like to proceed with Librium taper.   1:10 PM on repeat evaluation patient is sleeping in the bed and his heart rate is normal.  Do not see any evidence of active alcohol withdrawal at this time.  I again discussed with him about Librium taper and he does report having 2 children's and wanting to get sober from them and wanting to proceed with the Librium taper.  He understands the risk of combining this with other substances such as alcohol or oxycodone he states that he will not do that.  Patient is tolerating eating and will call a ride to go home.      FINAL CLINICAL IMPRESSION(S) / ED DIAGNOSES   Final diagnoses:  Substance abuse ([place])  Alcohol withdrawal syndrome without complication ([place])     Rx / DC Orders   ED Discharge Orders          Ordered    chlordiazePOXIDE (LIBRIUM) 25 MG capsule  Multiple Frequencies        06/16/22 1318             Note:  This document was prepared using Dragon voice recognition software and may include unintentional dictation errors.   Vanessa Parkway, MD 06/16/22 1319

## 2022-06-16 NOTE — Discharge Instructions (Addendum)
You have to be in the right mindset to stop drinking and if you combine the Librium with alcohol or other drugs that can be dangerous and lead to death and permanent disability.  However you can use this Librium taper to try to help you stop drinking and return to the ER if develop worsening symptoms or any other concerns.  Do not combine this with any opioids, other benzodiazepines, alcohol.  Day 1: 50mg  q6h Day 2: 25mg  q6h Day 3: 25mg  q12h Day 4: 25mg  at night

## 2022-06-16 NOTE — ED Triage Notes (Signed)
Pt here with weakness and anxiety since yesterday. Pt states he was drinking beer, wine, and liquor all day yesterday and took a percocet and a testosterone pill. Pt then states that he got up this morning and took a oxycodone pill and drank and beer and he has felt like he is going to pass out ever since. Pt stable in triage but has tremors.

## 2022-06-16 NOTE — ED Triage Notes (Signed)
First RN: Pt from home via ems with reports that pt has been experiencing dizziness since yesterday also per ems pt drank heavily yesterday.

## 2022-06-23 ENCOUNTER — Ambulatory Visit: Payer: Medicaid Other

## 2022-08-17 ENCOUNTER — Ambulatory Visit: Payer: Medicaid Other

## 2023-01-12 IMAGING — CT CT HEAD W/O CM
4 series · 17 of 47 positions shown, 19 images · non-contrast
Comparison: June 22, 2021

CLINICAL DATA: Altered mental status.



[Series 2: head wo · axial · 0.45mm/px · z∈[-121,-6]mm · 7 of 31 slices shown, 9 images]
[im 4/31  brain]
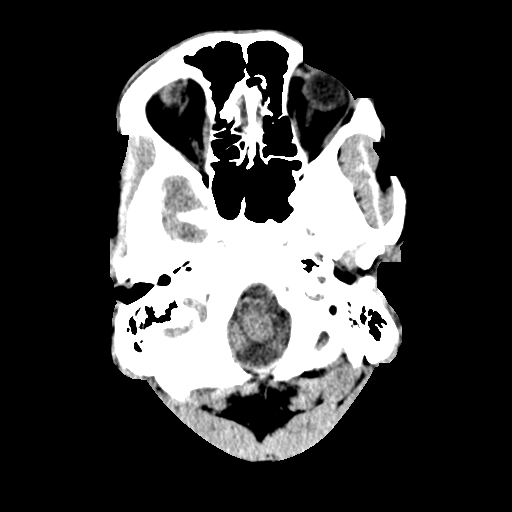
[im 4/31  bone]
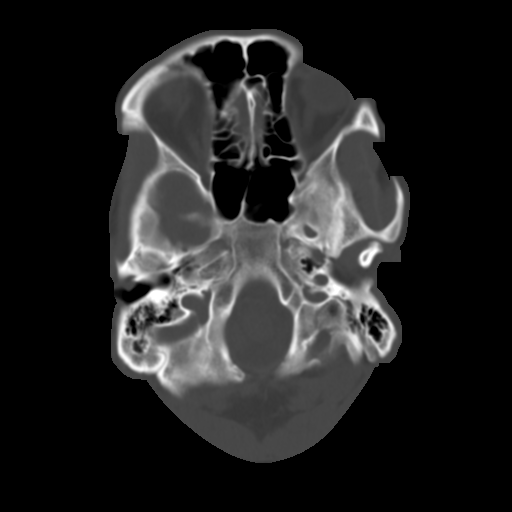
[im 8/31  brain]
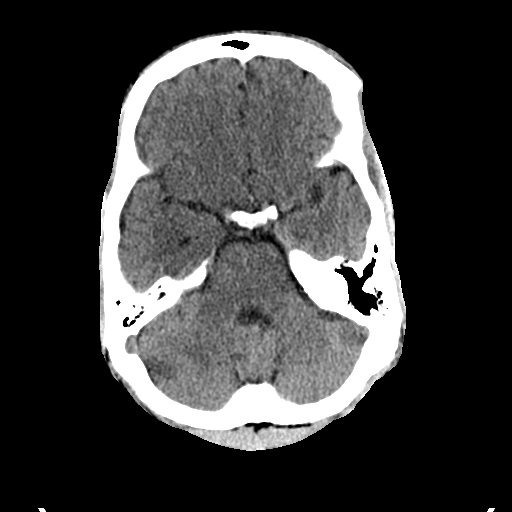
[im 12/31  brain]
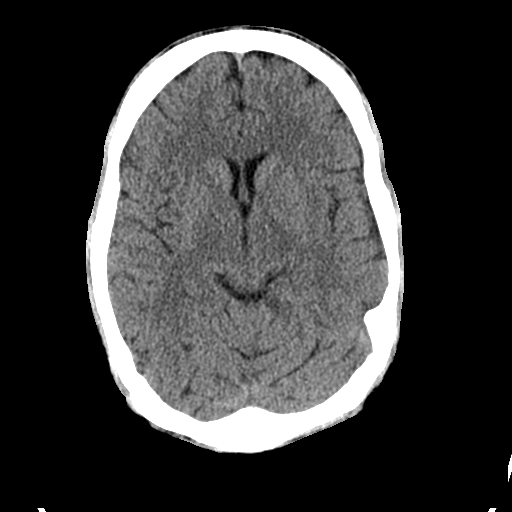
[im 16/31  brain]
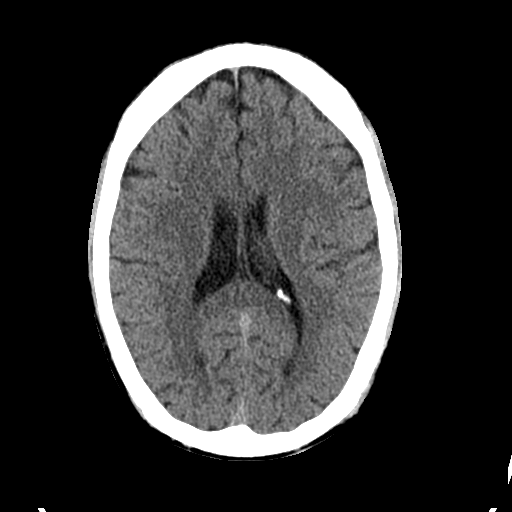
[im 19/31  brain]
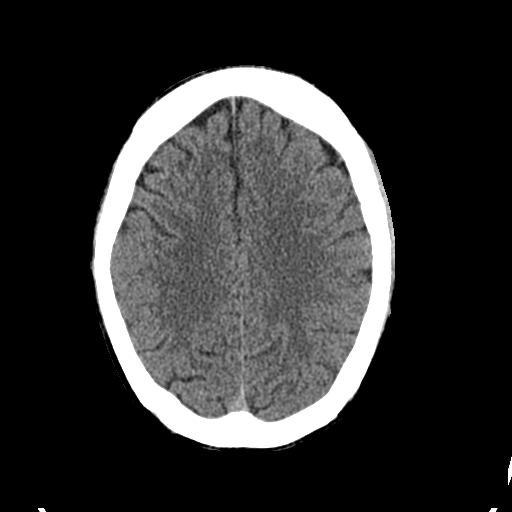
[im 19/31  bone]
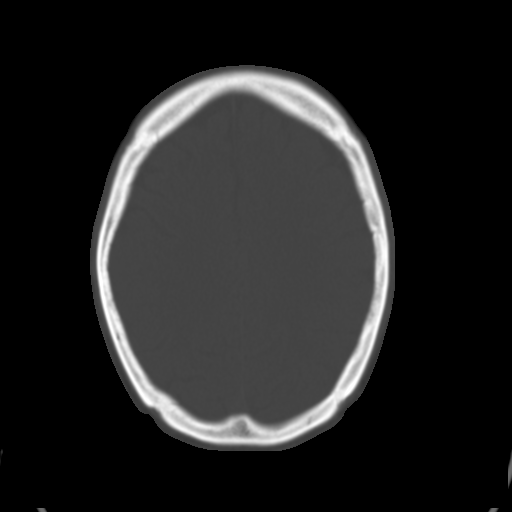
[im 23/31  brain]
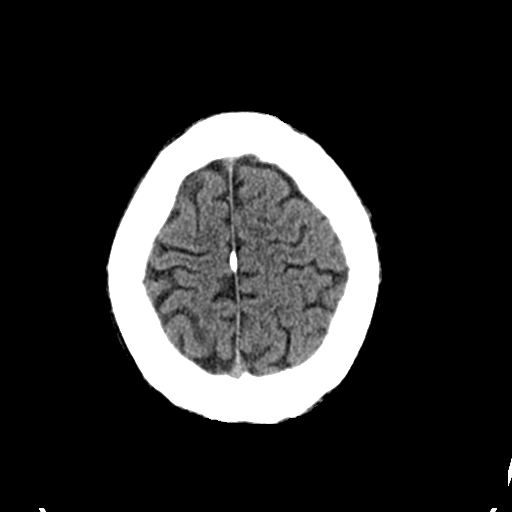
[im 27/31  brain]
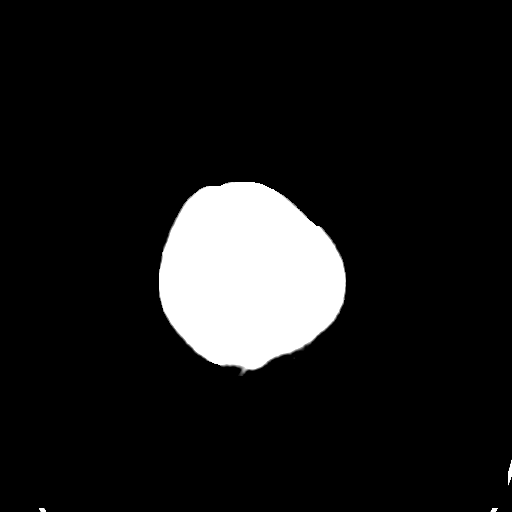

[Series 3: head bone · axial · 0.45mm/px · z∈[-122,-70]mm · 4 of 76 slices shown]
[im 8/76  bone]
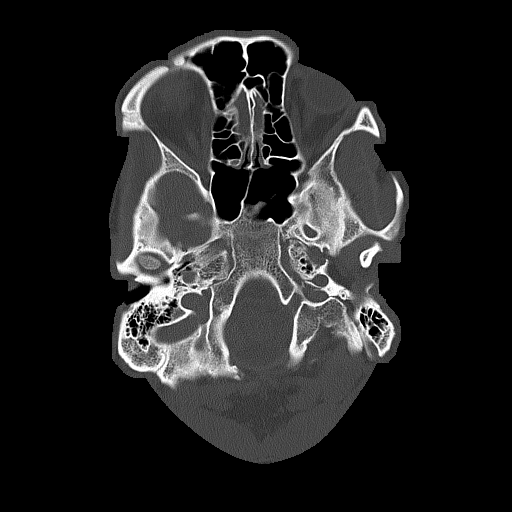
[im 16/76  bone]
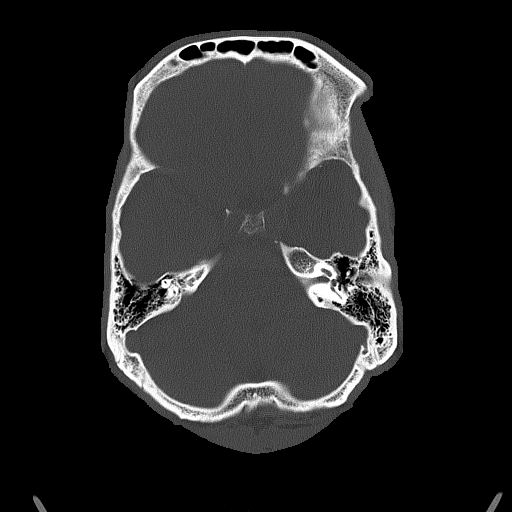
[im 23/76  bone]
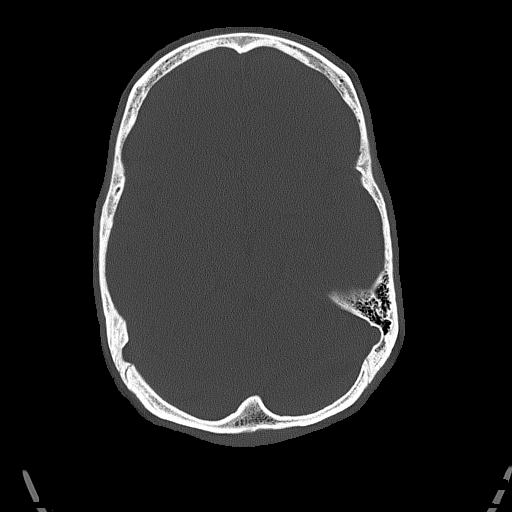
[im 34/76  bone]
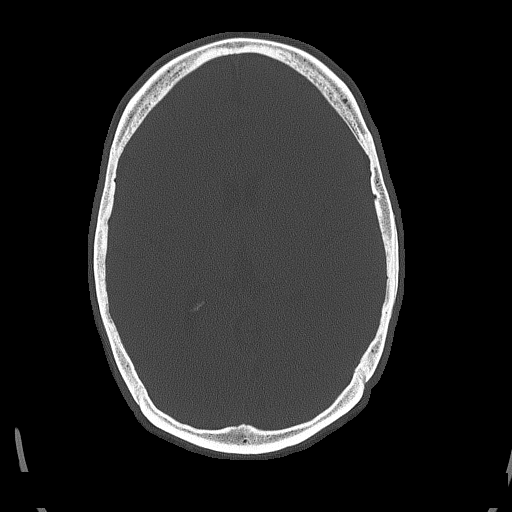

[Series 4: coronal soft tissue · coronal · 0.33mm/px · 3 of 70 slices shown]
[im 24/70  brain]
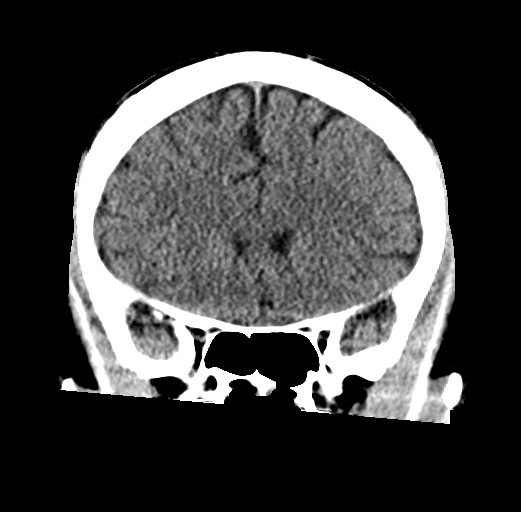
[im 31/70  brain]
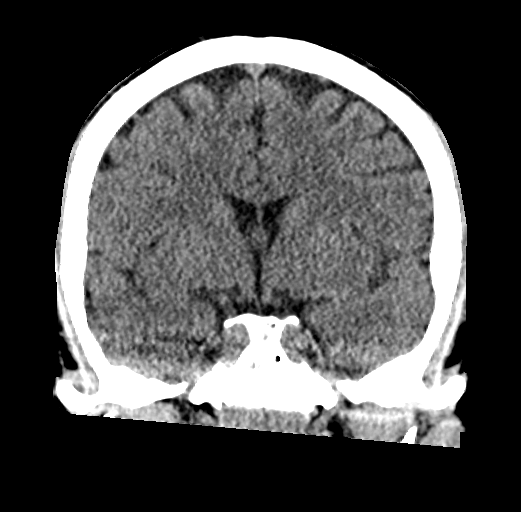
[im 39/70  brain]
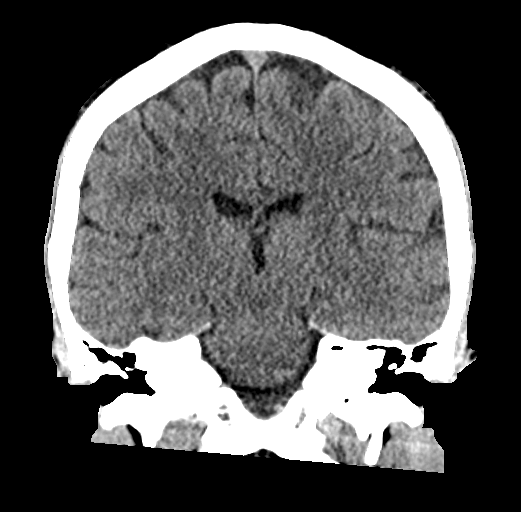

[Series 5: sagittal soft tissue · sagittal · 0.33mm/px · 3 of 58 slices shown]
[im 20/58  brain]
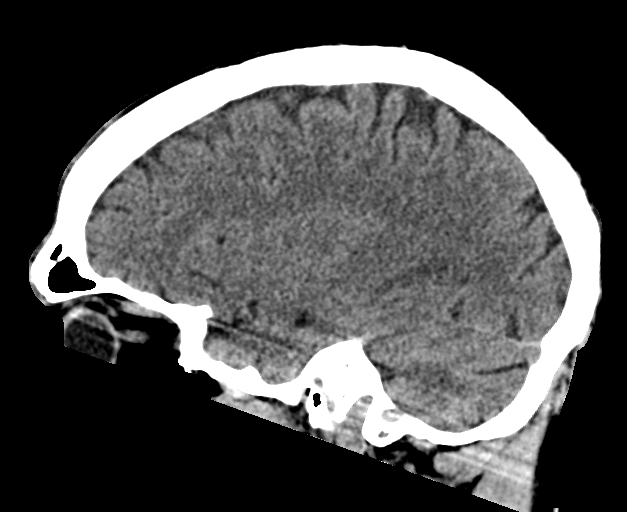
[im 29/58  brain]
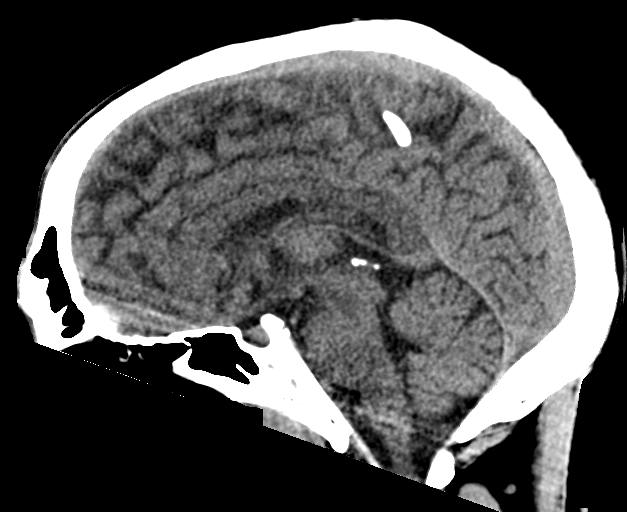
[im 39/58  brain]
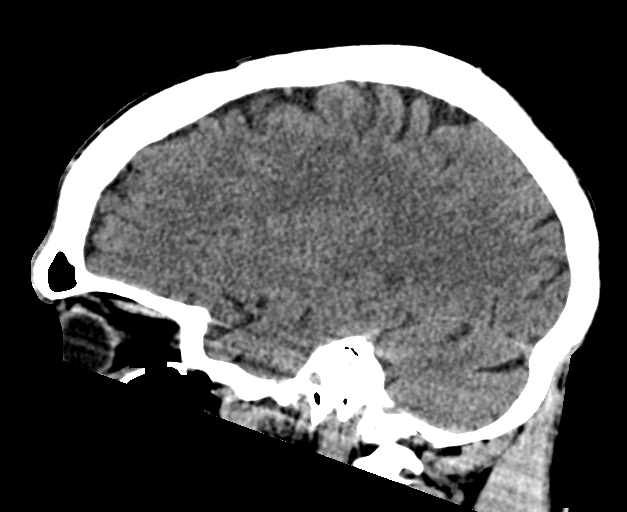

[17 of 47 positions shown; findings below may reference images not displayed]

FINDINGS: Brain: No evidence of acute infarction, hemorrhage, hydrocephalus,
extra-axial collection or mass lesion/mass effect.

Vascular: No hyperdense vessel or unexpected calcification.

Skull: Normal. Negative for fracture or focal lesion.

Sinuses/Orbits: No acute finding.

Other: None.
IMPRESSION: No acute intracranial pathology.

## 2023-01-26 ENCOUNTER — Emergency Department (HOSPITAL_COMMUNITY)
Admission: EM | Admit: 2023-01-26 | Discharge: 2023-01-27 | Disposition: A | Discharge status: Home / Self Care | Payer: MEDICAID | Attending: Emergency Medicine | Admitting: Emergency Medicine

## 2023-01-26 ENCOUNTER — Other Ambulatory Visit: Payer: Self-pay

## 2023-01-26 DIAGNOSIS — Y905 Blood alcohol level of 100-119 mg/100 ml: Secondary | ICD-10-CM | POA: Insufficient documentation

## 2023-01-26 DIAGNOSIS — F1020 Alcohol dependence, uncomplicated: Secondary | ICD-10-CM | POA: Insufficient documentation

## 2023-01-26 DIAGNOSIS — R251 Tremor, unspecified: Secondary | ICD-10-CM | POA: Diagnosis present

## 2023-01-26 DIAGNOSIS — F10230 Alcohol dependence with withdrawal, uncomplicated: Secondary | ICD-10-CM

## 2023-01-26 LAB — CBC
HCT: 44.1 % (ref 39.0–52.0)
Hemoglobin: 14.8 g/dL (ref 13.0–17.0)
MCH: 28.8 pg (ref 26.0–34.0)
MCHC: 33.6 g/dL (ref 30.0–36.0)
MCV: 86 fL (ref 80.0–100.0)
Platelets: 309 10*3/uL (ref 150–400)
RBC: 5.13 MIL/uL (ref 4.22–5.81)
RDW: 13.4 % (ref 11.5–15.5)
WBC: 8.9 10*3/uL (ref 4.0–10.5)
nRBC: 0 % (ref 0.0–0.2)

## 2023-01-26 LAB — RAPID URINE DRUG SCREEN, HOSP PERFORMED
Amphetamines: NOT DETECTED
Barbiturates: NOT DETECTED
Benzodiazepines: NOT DETECTED
Cocaine: NOT DETECTED
Opiates: NOT DETECTED
Tetrahydrocannabinol: NOT DETECTED

## 2023-01-26 LAB — COMPREHENSIVE METABOLIC PANEL
ALT: 19 U/L (ref 0–44)
AST: 33 U/L (ref 15–41)
Albumin: 4.5 g/dL (ref 3.5–5.0)
Alkaline Phosphatase: 93 U/L (ref 38–126)
Anion gap: 16 — ABNORMAL HIGH (ref 5–15)
BUN: 6 mg/dL (ref 6–20)
CO2: 24 mmol/L (ref 22–32)
Calcium: 9.4 mg/dL (ref 8.9–10.3)
Chloride: 93 mmol/L — ABNORMAL LOW (ref 98–111)
Creatinine, Ser: 0.85 mg/dL (ref 0.61–1.24)
GFR, Estimated: >60 mL/min (ref >60)
Glucose, Bld: 94 mg/dL (ref 70–99)
Potassium: 3.7 mmol/L (ref 3.5–5.1)
Sodium: 133 mmol/L — ABNORMAL LOW (ref 135–145)
Total Bilirubin: 0.7 mg/dL (ref 0.3–1.2)
Total Protein: 8 g/dL (ref 6.5–8.1)

## 2023-01-26 LAB — ETHANOL: Alcohol, Ethyl (B): 114 mg/dL — ABNORMAL HIGH (ref <10)

## 2023-01-26 NOTE — ED Triage Notes (Addendum)
Pt arrives to ED c/o tremors,nausea. Pt states that he needs DETOX for alcohol. Pt reports that he drinks 2 cases of beer a day along with liquor. Pt reports that his last drink was at noon. Pt also endorses use of narcotics/oxycodone. No visible tremor upon assessment.

## 2023-01-27 MED ORDER — LORAZEPAM 2 MG/ML IJ SOLN: 0.0000 mg | Freq: Four times a day (QID) | INTRAMUSCULAR | Status: DC

## 2023-01-27 MED ORDER — LORAZEPAM 1 MG PO TABS
0.0000 mg | ORAL_TABLET | Freq: Four times a day (QID) | ORAL | Status: DC
  Administered 2023-01-27: 1 mg via ORAL
  Filled 2023-01-27: qty 1

## 2023-01-27 MED ORDER — LORAZEPAM 2 MG/ML IJ SOLN: 0.0000 mg | Freq: Two times a day (BID) | INTRAMUSCULAR | Status: DC

## 2023-01-27 MED ORDER — THIAMINE MONONITRATE 100 MG PO TABS: 100.0000 mg | ORAL_TABLET | Freq: Every day | ORAL | Status: DC

## 2023-01-27 MED ORDER — CHLORDIAZEPOXIDE HCL 25 MG PO CAPS: ORAL_CAPSULE | ORAL | 0 refills | Status: DC

## 2023-01-27 MED ORDER — LORAZEPAM 1 MG PO TABS
0.5000 mg | ORAL_TABLET | Freq: Once | ORAL | Status: AC
  Administered 2023-01-27: 0.5 mg via ORAL
  Filled 2023-01-27: qty 1

## 2023-01-27 MED ORDER — ONDANSETRON HCL 4 MG/2ML IJ SOLN
4.0000 mg | Freq: Once | INTRAMUSCULAR | Status: AC
  Administered 2023-01-27: 4 mg via INTRAVENOUS
  Filled 2023-01-27: qty 2

## 2023-01-27 MED ORDER — THIAMINE HCL 100 MG/ML IJ SOLN: 100.0000 mg | Freq: Every day | INTRAMUSCULAR | Status: DC

## 2023-01-27 MED ORDER — SODIUM CHLORIDE 0.9 % IV BOLUS
1000.0000 mL | Freq: Once | INTRAVENOUS | Status: AC
  Administered 2023-01-27: 1000 mL via INTRAVENOUS

## 2023-01-27 MED ORDER — ONDANSETRON HCL 4 MG PO TABS: 4.0000 mg | ORAL_TABLET | Freq: Four times a day (QID) | ORAL | 0 refills | Status: DC

## 2023-01-27 MED ORDER — LORAZEPAM 1 MG PO TABS: 0.0000 mg | ORAL_TABLET | Freq: Two times a day (BID) | ORAL | Status: DC

## 2023-01-27 NOTE — Discharge Instructions (Addendum)
You were seen in the department with concern for alcohol withdrawal.  Your lab work was reassuring.  We gave you some medication to help with your symptoms.  I have sent a Librium taper to your pharmacy, as well as some nausea medication.  I have attached resources for both residential and outpatient substance abuse treatment.  Continue to monitor how you're doing and return to the ER for new or worsening symptoms.

## 2023-01-27 NOTE — ED Notes (Signed)
Discharge instructions discussed with pt. Verbalized understanding.No questions or concerns regarding discharge  

## 2023-01-27 NOTE — ED Provider Notes (Signed)
[place] EMERGENCY DEPARTMENT AT [facility] Provider Note   CSN: [id number] Arrival date & time: 01/26/23  1943     History  Chief Complaint  Patient presents with   Tremors    Ryan Graham is a 32 y.o. male with a history of polysubstance abuse, alcohol dependence, who presents to the emergency department complaining of tremors and nausea. Patient states that he needs help detoxing from alcohol. Feels as though he is going to have a seizure, believes that he has had a seizure from withdrawing in the past. He is unsure how much he normally drinks in a day, but frequents beer, wine, and liquor. He is also been taking Percocet. Last drink was 10 AM yesterday. Feels dehydrated. Denies hallucinations, headache.   HPI     Home Medications Prior to Admission medications   Medication Sig Start Date End Date Taking? Authorizing Provider  chlordiazePOXIDE (LIBRIUM) 25 MG capsule 50mg  PO TID x 1D, then 25-50mg  PO BID X 1D, then 25-50mg  PO QD X 1D 01/27/23  Yes Tritia Endo T, PA-C  ondansetron (ZOFRAN) 4 MG tablet Take 1 tablet (4 mg total) by mouth every 6 (six) hours. 01/27/23  Yes Nikka Hakimian T, PA-C  B Complex-C-Folic Acid (B COMPLEX-VITAMIN C-FOLIC ACID) 1 MG tablet Take 1 tablet by mouth daily with breakfast. 09/05/21   Ward, Layla Maw, DO  hydrOXYzine (ATARAX) 10 MG tablet Take 10 mg by mouth 3 (three) times daily as needed (for up to 10 days).    [provider]  LORazepam (ATIVAN) 1 MG tablet Take 1 mg by mouth 2 (two) times daily as needed for anxiety (for up to 3 days).    [provider]  thiamine 100 MG tablet Take 1 tablet (100 mg total) by mouth daily. 09/05/21   Ward, Layla Maw, DO      Allergies    Patient has no known allergies.    Review of Systems   Review of Systems  Gastrointestinal:  Positive for nausea.  Neurological:  Positive for tremors.  All other systems reviewed and are negative.   Physical Exam Updated Vital  Signs BP 134/69   Pulse 77   Temp 98 F (36.7 C)   Resp 17   Ht 6\' 2"  (1.88 m)   Wt 68 kg   SpO2 98%   BMI 19.26 kg/m  Physical Exam Vitals and nursing note reviewed.  Constitutional:      Appearance: Normal appearance.  HENT:     Head: Normocephalic and atraumatic.  Eyes:     Conjunctiva/sclera: Conjunctivae normal.  Pulmonary:     Effort: Pulmonary effort is normal. No respiratory distress.  Skin:    General: Skin is warm and dry.  Neurological:     Mental Status: He is alert.     Comments: Mild tremor in hands with arms extended   Psychiatric:        Mood and Affect: Mood normal.        Behavior: Behavior normal.     ED Results / Procedures / Treatments   Labs (all labs ordered are listed, but only abnormal results are displayed) Labs Reviewed  COMPREHENSIVE METABOLIC PANEL - Abnormal; Notable for the following components:      Result Value   Sodium 133 (*)    Chloride 93 (*)    Anion gap 16 (*)    All other components within normal limits  ETHANOL - Abnormal; Notable for the following components:   Alcohol, Ethyl (  B) 114 (*)    All other components within normal limits  CBC  RAPID URINE DRUG SCREEN, HOSP PERFORMED    EKG None  Radiology No results found.  Procedures Procedures    Medications Ordered in ED Medications  thiamine (VITAMIN B1) tablet 100 mg (has no administration in time range)    Or  thiamine (VITAMIN B1) injection 100 mg (has no administration in time range)  sodium chloride 0.9 % bolus 1,000 mL (0 mLs Intravenous Stopped 01/27/23 0230)  ondansetron (ZOFRAN) injection 4 mg (4 mg Intravenous Given 01/27/23 0057)  LORazepam (ATIVAN) tablet 0.5 mg (0.5 mg Oral Given 01/27/23 4098)    ED Course/ Medical Decision Making/ A&P                             Medical Decision Making Risk OTC drugs. Prescription drug management.   This patient is a 32 y.o. male  who presents to the ED for concern of tremors and nausea, concern for  alcohol withdrawal.    Differential diagnoses prior to evaluation: The emergent differential diagnosis includes, but is not limited to,  ETOH intoxication, withdrawal, anxiety. This is not an exhaustive differential.    Past Medical History / Co-morbidities / Social History: polysubstance abuse, alcohol dependence   Additional history: Chart reviewed. Pertinent results include: Prior admission in December 2022 in the setting of polysubstance abuse, alcohol withdrawal, and seizure.   Physical Exam: Physical exam performed. The pertinent findings include: Patient appears anxious, but no acute distress.  Normal vital signs.  Mild tremor in the hands with arms extended.  No hallucinations. Initial CIWA 6, repeat CIWA after observation was 3. Ambulating normally.     Lab Tests/Imaging studies: I personally interpreted labs/imaging and the pertinent results include: CBC normal, CMP with sodium 133, chloride 93.  Ethanol 114.  UDS negative.   Cardiac monitoring: Reviewed prior EKG's, normal QTC   Medications: I ordered medication including IVF, Zofran, thiamine, Ativan PRN per CIWA protocol..  I have reviewed the patients home medicines and have made adjustments as needed. Patient tolerating PO.    Disposition: After consideration of the diagnostic results and the patients response to treatment, I feel that emergency department workup does not suggest an emergent condition requiring admission or immediate intervention beyond what has been performed at this time. The plan is: discharge to home with symptomatic management of alcohol withdrawal. No seizure like activity during 8 hr period of observation in the ER. Patient wants to try librium taper and is interested in substance abuse treatment, either inpatient or outpatient. Also requesting nausea medication. The patient is safe for discharge and has been instructed to return immediately for worsening symptoms, change in symptoms or any other  concerns.  Final Clinical Impression(s) / ED Diagnoses Final diagnoses:  Alcohol dependence with uncomplicated withdrawal (HCC)    Rx / DC Orders ED Discharge Orders          Ordered    chlordiazePOXIDE (LIBRIUM) 25 MG capsule        01/27/23 0329    ondansetron (ZOFRAN) 4 MG tablet  Every 6 hours        01/27/23 0329           Portions of this report may have been transcribed using voice recognition software. Every effort was made to ensure accuracy; however, inadvertent computerized transcription errors may be present.    Su Monks, [place] 01/27/23 [phone]  Mesner, Barbara Cower, MD 01/27/23 [phone]

## 2023-02-05 ENCOUNTER — Emergency Department (HOSPITAL_COMMUNITY)
Admission: EM | Admit: 2023-02-05 | Discharge: 2023-02-06 | Disposition: A | Discharge status: Home / Self Care | Payer: MEDICAID | Attending: Emergency Medicine | Admitting: Emergency Medicine

## 2023-02-05 DIAGNOSIS — Y908 Blood alcohol level of 240 mg/100 ml or more: Secondary | ICD-10-CM | POA: Diagnosis not present

## 2023-02-05 DIAGNOSIS — F10220 Alcohol dependence with intoxication, uncomplicated: Secondary | ICD-10-CM | POA: Diagnosis not present

## 2023-02-05 DIAGNOSIS — R0781 Pleurodynia: Secondary | ICD-10-CM | POA: Diagnosis present

## 2023-02-05 DIAGNOSIS — F10920 Alcohol use, unspecified with intoxication, uncomplicated: Secondary | ICD-10-CM

## 2023-02-05 HISTORY — DX: Alcohol dependence, uncomplicated: F10.20

## 2023-02-06 ENCOUNTER — Other Ambulatory Visit: Payer: Self-pay

## 2023-02-06 ENCOUNTER — Encounter (HOSPITAL_COMMUNITY): Payer: Self-pay | Admitting: Emergency Medicine

## 2023-02-06 ENCOUNTER — Emergency Department (HOSPITAL_COMMUNITY): Payer: MEDICAID

## 2023-02-06 LAB — CBC WITH DIFFERENTIAL/PLATELET
Abs Immature Granulocytes: 0 10*3/uL (ref 0.00–0.07)
Basophils Absolute: 0 10*3/uL (ref 0.0–0.1)
Basophils Relative: 1 %
Eosinophils Absolute: 0 10*3/uL (ref 0.0–0.5)
Eosinophils Relative: 0 %
HCT: 41 % (ref 39.0–52.0)
Hemoglobin: 14 g/dL (ref 13.0–17.0)
Immature Granulocytes: 0 %
Lymphocytes Relative: 62 %
Lymphs Abs: 3.2 10*3/uL (ref 0.7–4.0)
MCH: 29.2 pg (ref 26.0–34.0)
MCHC: 34.1 g/dL (ref 30.0–36.0)
MCV: 85.6 fL (ref 80.0–100.0)
Monocytes Absolute: 0.5 10*3/uL (ref 0.1–1.0)
Monocytes Relative: 10 %
Neutro Abs: 1.4 10*3/uL — ABNORMAL LOW (ref 1.7–7.7)
Neutrophils Relative %: 27 %
Platelets: 240 10*3/uL (ref 150–400)
RBC: 4.79 MIL/uL (ref 4.22–5.81)
RDW: 14.3 % (ref 11.5–15.5)
WBC: 5.1 10*3/uL (ref 4.0–10.5)
nRBC: 0 % (ref 0.0–0.2)

## 2023-02-06 LAB — COMPREHENSIVE METABOLIC PANEL
ALT: 23 U/L (ref 0–44)
AST: 36 U/L (ref 15–41)
Albumin: 4.2 g/dL (ref 3.5–5.0)
Alkaline Phosphatase: 100 U/L (ref 38–126)
Anion gap: 12 (ref 5–15)
BUN: <5 mg/dL — ABNORMAL LOW (ref 6–20)
CO2: 25 mmol/L (ref 22–32)
Calcium: 8.9 mg/dL (ref 8.9–10.3)
Chloride: 100 mmol/L (ref 98–111)
Creatinine, Ser: 0.81 mg/dL (ref 0.61–1.24)
GFR, Estimated: >60 mL/min (ref >60)
Glucose, Bld: 99 mg/dL (ref 70–99)
Potassium: 3.7 mmol/L (ref 3.5–5.1)
Sodium: 137 mmol/L (ref 135–145)
Total Bilirubin: 0.3 mg/dL (ref 0.3–1.2)
Total Protein: 7.6 g/dL (ref 6.5–8.1)

## 2023-02-06 LAB — ETHANOL: Alcohol, Ethyl (B): 327 mg/dL (ref <10)

## 2023-02-06 MED ORDER — CYCLOBENZAPRINE HCL 10 MG PO TABS: 10.0000 mg | ORAL_TABLET | Freq: Two times a day (BID) | ORAL | 0 refills | Status: AC | PRN

## 2023-02-06 MED ORDER — NICOTINE 14 MG/24HR TD PT24: 14.0000 mg | MEDICATED_PATCH | Freq: Every day | TRANSDERMAL | Status: DC

## 2023-02-06 MED ORDER — NICOTINE 14 MG/24HR TD PT24
14.0000 mg | MEDICATED_PATCH | TRANSDERMAL | Status: DC
  Administered 2023-02-06: 14 mg via TRANSDERMAL
  Filled 2023-02-06: qty 1

## 2023-02-06 MED ORDER — ACETAMINOPHEN 500 MG PO TABS
1000.0000 mg | ORAL_TABLET | Freq: Once | ORAL | Status: AC
  Administered 2023-02-06: 1000 mg via ORAL

## 2023-02-06 MED ORDER — CYCLOBENZAPRINE HCL 10 MG PO TABS
10.0000 mg | ORAL_TABLET | Freq: Once | ORAL | Status: AC
  Administered 2023-02-06: 10 mg via ORAL

## 2023-02-06 MED ORDER — ACETAMINOPHEN 500 MG PO TABS
ORAL_TABLET | ORAL | Status: AC
  Filled 2023-02-06: qty 2

## 2023-02-06 MED ORDER — CYCLOBENZAPRINE HCL 10 MG PO TABS
ORAL_TABLET | ORAL | Status: AC
  Filled 2023-02-06: qty 1

## 2023-02-06 NOTE — Discharge Instructions (Addendum)
Likely your pain is a muscle bruise, recommend over-the-counter pain medications, given you muscle relaxer to use as needed follow-up with the [facility] for further assessment.   I have also given you a prescription for a muscle relaxer this can make you drowsy do not consume alcohol or operate heavy machinery when taking this medication.    2.  Alcohol use- have given you resources for outpatient help with alcohol use if you feel that you need it.  Come back to the emergency department if you develop chest pain, shortness of breath, severe abdominal pain, uncontrolled nausea, vomiting, diarrhea.

## 2023-02-06 NOTE — ED Triage Notes (Signed)
Pt BIB EMS from home with c/o alcohol withdrawal, states that he is having abdominal pain, nausea, headache, and tremors. Pt's last drink was x 30 minutes ago which he states was half a can of beer. Pt seen recently for detox, but did not pick up the librium that was prescribed for him. Pt does appear tremulous at time of triage, playing on his tablet.   20 LAC LR 4mg  IV Zofran

## 2023-02-06 NOTE — ED Provider Notes (Signed)
[place] [facility]ERGENCY DEPARTMENT AT [facility] Provider Note   CSN: [id number] Arrival date & time: 02/05/23  2356     History  Chief Complaint  Patient presents with   Withdrawal    Ryan Graham is a 32 y.o. male.  HPI   Patient with medical history including polysubstance use, alcohol dependency, presenting with complaints of withdrawals as well as left-sided rib pain.  Patient states that he fell 2 days ago and landed on his left rib, states that he has been having pain since then, he denies any bloody sputum, pleuritic chest pain or shortness of breath he denies no actual chest pain at this time.  He states that he drank all day yesterday, drank approximate 24 beers, states that he drinks daily, he denies SI/HI, he has no other complaints at this time.  He denies any recent falls, no endorsing stomach pains nausea vomiting, denies any pain in the upper and or lower extremities.  Review patient's chart has been seen in the past for similar presentations most recent seen a few days ago, benign workup and he was later discharged home. Home Medications Prior to Admission medications   Medication Sig Start Date End Date Taking? Authorizing Provider  cyclobenzaprine (FLEXERIL) 10 MG tablet Take 1 tablet (10 mg total) by mouth 2 (two) times daily as needed for muscle spasms. 02/06/23  Yes Carroll Sage, PA-C  B Complex-C-Folic Acid (B COMPLEX-VITAMIN C-FOLIC ACID) 1 MG tablet Take 1 tablet by mouth daily with breakfast. 09/05/21   Ward, Layla Maw, DO  chlordiazePOXIDE (LIBRIUM) 25 MG capsule 50mg  PO TID x 1D, then 25-50mg  PO BID X 1D, then 25-50mg  PO QD X 1D 01/27/23   Roemhildt, Lorin T, PA-C  hydrOXYzine (ATARAX) 10 MG tablet Take 10 mg by mouth 3 (three) times daily as needed (for up to 10 days).    [provider]  LORazepam (ATIVAN) 1 MG tablet Take 1 mg by mouth 2 (two) times daily as needed for anxiety (for up to 3 days).    [provider]   ondansetron (ZOFRAN) 4 MG tablet Take 1 tablet (4 mg total) by mouth every 6 (six) hours. 01/27/23   Roemhildt, Lorin T, PA-C  thiamine 100 MG tablet Take 1 tablet (100 mg total) by mouth daily. 09/05/21   Ward, Layla Maw, DO      Allergies    Patient has no known allergies.    Review of Systems   Review of Systems  Constitutional:  Negative for chills and fever.  Respiratory:  Negative for shortness of breath.   Cardiovascular:  Negative for chest pain.  Gastrointestinal:  Negative for abdominal pain.  Neurological:  Negative for headaches.    Physical Exam Updated Vital Signs BP 133/70   Pulse 71   Temp 98.5 F (36.9 C)   Resp 18   Ht 6\' 2"  (1.88 m)   Wt 68 kg   SpO2 100%   BMI 19.25 kg/m  Physical Exam Vitals and nursing note reviewed.  Constitutional:      General: He is not in acute distress.    Appearance: He is not ill-appearing.  HENT:     Head: Normocephalic and atraumatic.     Comments: No evidence of head trauma, no raccoon eyes or Battle sign noted.    Nose: No congestion.     Mouth/Throat:     Mouth: Mucous membranes are moist.     Pharynx: Oropharynx is clear.  Comments: No trismus no torticollis no oral trauma present. Eyes:     Extraocular Movements: Extraocular movements intact.     Conjunctiva/sclera: Conjunctivae normal.     Pupils: Pupils are equal, round, and reactive to light.  Cardiovascular:     Rate and Rhythm: Normal rate and regular rhythm.     Pulses: Normal pulses.     Heart sounds: No murmur heard.    No friction rub. No gallop.  Pulmonary:     Effort: No respiratory distress.     Breath sounds: No wheezing, rhonchi or rales.     Comments: No bony abnormality of the chest present, patient point tenderness noted in the left anterior aspect of the fifth and sixth rib, lung sounds are clear bilaterally. Abdominal:     Palpations: Abdomen is soft.     Tenderness: There is no abdominal tenderness. There is no right CVA tenderness or  left CVA tenderness.  Musculoskeletal:     Comments: Spine was palpated was nontender to palpation no step-off deformities noted no pelvis instability no leg shortening.  Skin:    General: Skin is warm and dry.  Neurological:     Mental Status: He is alert.     Comments: No facial asymmetry no difficulty with word finding following two-step commands there is no unilateral weakness present.  Psychiatric:        Mood and Affect: Mood normal.     ED Results / Procedures / Treatments   Labs (all labs ordered are listed, but only abnormal results are displayed) Labs Reviewed  ETHANOL - Abnormal; Notable for the following components:      Result Value   Alcohol, Ethyl (B) 327 (*)    All other components within normal limits  CBC WITH DIFFERENTIAL/PLATELET - Abnormal; Notable for the following components:   Neutro Abs 1.4 (*)    All other components within normal limits  COMPREHENSIVE METABOLIC PANEL - Abnormal; Notable for the following components:   BUN <5 (*)    All other components within normal limits    EKG EKG Interpretation Date/Time:  Sunday February 06 2023 05:38:48 EDT Ventricular Rate:  71 PR Interval:  128 QRS Duration:  88 QT Interval:  404 QTC Calculation: 439 R Axis:   93  Text Interpretation: Normal sinus rhythm with sinus arrhythmia Rightward axis Borderline ECG When compared with ECG of 16-Jun-2022 10:02, PREVIOUS ECG IS PRESENT No significant change was found Confirmed by Glynn Octave [phone]) on 02/06/2023 5:41:45 AM  Radiology DG Ribs Unilateral W/Chest Left  Result Date: 02/06/2023 CLINICAL DATA:  Left rib pain following fall. EXAM: LEFT RIBS AND CHEST - 3+ VIEW COMPARISON:  06/22/2021. FINDINGS: No acute displaced fracture or other bone lesions are seen involving the ribs. There is no evidence of pneumothorax or pleural effusion. Both lungs are clear. Heart size and mediastinal contours are within normal limits. IMPRESSION: Negative. Electronically Signed    By: Thornell Sartorius M.D.   On: 02/06/2023 04:08    Procedures Procedures    Medications Ordered in ED Medications  nicotine (NICODERM CQ - dosed in mg/24 hours) patch 14 mg (14 mg Transdermal Patch Applied 02/06/23 0041)  acetaminophen (TYLENOL) 500 MG tablet (has no administration in time range)  cyclobenzaprine (FLEXERIL) 10 MG tablet (has no administration in time range)  acetaminophen (TYLENOL) tablet 1,000 mg (1,000 mg Oral Given 02/06/23 0041)  cyclobenzaprine (FLEXERIL) tablet 10 mg (10 mg Oral Given 02/06/23 0532)    ED Course/ Medical Decision Making/ A&P  Medical Decision Making Amount and/or Complexity of Data Reviewed Labs: ordered. Radiology: ordered.  Risk OTC drugs. Prescription drug management.   This patient presents to the ED for concern of chest pain, alcohol taxation, this involves an extensive number of treatment options, and is a complaint that carries with it a high risk of complications and morbidity.  The differential diagnosis includes pneumothorax, PE, pneumonia, SI/HI,    Additional history obtained:  Additional history obtained from N/A External records from outside source obtained and reviewed including ER notes   Co morbidities that complicate the patient evaluation  Alcohol dependency, polysubstance use  Social Determinants of Health:  No PCP    Lab Tests:  I Ordered, and personally interpreted labs.  The pertinent results include: CBC is unremarkable, CMP is unremarkable, ethanol is 327   Imaging Studies ordered:  I ordered imaging studies including unilateral rib x-ray I independently visualized and interpreted imaging which showed negative for acute findings I agree with the radiologist interpretation   Cardiac Monitoring:  The patient was maintained on a cardiac monitor.  I personally viewed and interpreted the cardiac monitored which showed an underlying rhythm of: N/A   Medicines ordered and  prescription drug management:  I ordered medication including n/a I have reviewed the patients home medicines and have made adjustments as needed  Critical Interventions:  N/a   Reevaluation:  Presents with withdrawals, rib pain, he is found asleep, resting comfortably, vital signs reassuring, will further assess with rib x-ray, p.o. challenge, and continue to monitor  X-rays was negative acute findings, patient was found asleep, tolerating p.o., agreement discharge at this time.    Consultations Obtained:  N/a    Test Considered:  N/a    Rule out Suspicion for pneumothorax rib fracture low at this time his x-rays and for these findings, lung sounds are clear bilaterally.  I doubt PE as he is PERC negative.  Doubt ACS presentation atypical he has no cardiac history, pain is focalized and easily reproducible, is worsened with movements which is atypical for ACS, EKG is also without signs of ischemia.  I doubt patient has been for alcohol withdrawal was as he is downtrending so my exam tolerating p.o. vital signs reassuring.  I doubt HI SI as he denies this, he does not appear to be responding internal stimuli.    Dispostion and problem list  After consideration of the diagnostic results and the patients response to treatment, I feel that the patent would benefit from discharge.  Rib pain-likely muscular in nature, recommend over-the-counter pain medications, follow-up PCP as needed Alcohol intoxication-will provide with outpatient therapies for him to pursue if he wants to.            Final Clinical Impression(s) / ED Diagnoses Final diagnoses:  Rib pain  Alcoholic intoxication without complication (HCC)    Rx / DC Orders ED Discharge Orders          Ordered    cyclobenzaprine (FLEXERIL) 10 MG tablet  2 times daily PRN        02/06/23 0528              Carroll Sage, PA-C 02/06/23 0542    Glynn Octave, MD 02/06/23 [phone]

## 2023-08-02 ENCOUNTER — Emergency Department
Admission: EM | Admit: 2023-08-02 | Discharge: 2023-08-02 | Disposition: A | Discharge status: Home / Self Care | Payer: MEDICAID | Attending: Emergency Medicine | Admitting: Emergency Medicine

## 2023-08-02 ENCOUNTER — Other Ambulatory Visit: Payer: Self-pay

## 2023-08-02 DIAGNOSIS — Y908 Blood alcohol level of 240 mg/100 ml or more: Secondary | ICD-10-CM | POA: Diagnosis not present

## 2023-08-02 DIAGNOSIS — F1020 Alcohol dependence, uncomplicated: Secondary | ICD-10-CM | POA: Diagnosis present

## 2023-08-02 DIAGNOSIS — R1084 Generalized abdominal pain: Secondary | ICD-10-CM | POA: Diagnosis not present

## 2023-08-02 LAB — COMPREHENSIVE METABOLIC PANEL
ALT: 33 U/L (ref 0–44)
AST: 66 U/L — ABNORMAL HIGH (ref 15–41)
Albumin: 4.5 g/dL (ref 3.5–5.0)
Alkaline Phosphatase: 146 U/L — ABNORMAL HIGH (ref 38–126)
Anion gap: 10 (ref 5–15)
BUN: 7 mg/dL (ref 6–20)
CO2: 25 mmol/L (ref 22–32)
Calcium: 8.8 mg/dL — ABNORMAL LOW (ref 8.9–10.3)
Chloride: 102 mmol/L (ref 98–111)
Creatinine, Ser: 0.7 mg/dL (ref 0.61–1.24)
GFR, Estimated: >60 mL/min (ref >60)
Glucose, Bld: 100 mg/dL — ABNORMAL HIGH (ref 70–99)
Potassium: 3.8 mmol/L (ref 3.5–5.1)
Sodium: 137 mmol/L (ref 135–145)
Total Bilirubin: 0.7 mg/dL (ref 0.0–1.2)
Total Protein: 7.7 g/dL (ref 6.5–8.1)

## 2023-08-02 LAB — CBC
HCT: 40.4 % (ref 39.0–52.0)
Hemoglobin: 13.9 g/dL (ref 13.0–17.0)
MCH: 31.2 pg (ref 26.0–34.0)
MCHC: 34.4 g/dL (ref 30.0–36.0)
MCV: 90.6 fL (ref 80.0–100.0)
Platelets: 213 10*3/uL (ref 150–400)
RBC: 4.46 MIL/uL (ref 4.22–5.81)
RDW: 18.4 % — ABNORMAL HIGH (ref 11.5–15.5)
WBC: 4.7 10*3/uL (ref 4.0–10.5)
nRBC: 0 % (ref 0.0–0.2)

## 2023-08-02 LAB — URINALYSIS, ROUTINE W REFLEX MICROSCOPIC
Bilirubin Urine: NEGATIVE
Glucose, UA: NEGATIVE mg/dL
Hgb urine dipstick: NEGATIVE
Ketones, ur: NEGATIVE mg/dL
Leukocytes,Ua: NEGATIVE
Nitrite: NEGATIVE
Protein, ur: NEGATIVE mg/dL
Specific Gravity, Urine: 1.015 (ref 1.005–1.030)
pH: 7 (ref 5.0–8.0)

## 2023-08-02 LAB — ETHANOL: Alcohol, Ethyl (B): 317 mg/dL (ref <10)

## 2023-08-02 LAB — LIPASE, BLOOD: Lipase: 48 U/L (ref 11–51)

## 2023-08-02 NOTE — ED Triage Notes (Signed)
 Pt to ED for abd pain started November. Reports daily drinker, last drink this am. States rehab wanted him to come get liver checked. +diarrhea.  States basically needs clearance to get into rehab Denies SI/hI

## 2023-08-02 NOTE — ED Provider Triage Note (Signed)
 Emergency Medicine Provider Triage Evaluation Note  Ryan Graham , a 33 y.o. male  was evaluated in triage.  Pt complains of abdominal pain and clearance for treatment at [facility].  States that he drinks every day all day long.  Beer and wine.  Last drink at 8 am.    Review of Systems  Positive: Abdomianl pain,  Negative: No nausea, vomiting, diarrhea.   Physical Exam  BP 124/89   Pulse 95   Temp 98 F (36.7 C)   Resp 16   Ht 6' 2 (1.88 m)   Wt 72.6 kg   SpO2 99%   BMI 20.54 kg/m  Gen:   Awake, no distress   Resp:  Normal effort  MSK:   Moves extremities without difficulty  Other:  Abdomen flat, soft with diffuse tenderness.  BS present.   Medical Decision Making  Medically screening exam initiated at 9:59 AM.  Appropriate orders placed.  Ryan Graham was informed that the remainder of the evaluation will be completed by another provider, this initial triage assessment does not replace that evaluation, and the importance of remaining in the ED until their evaluation is complete.     Saunders Shona CROME, PA-C 08/02/23 1002

## 2023-08-02 NOTE — ED Provider Notes (Signed)
 [facility] Provider Note    Event Date/Time   First MD Initiated Contact with Patient 08/02/23 1326     (approximate)   History   Abdominal Pain   HPI  Ryan Graham is a 33 y.o. male with history of alcohol abuse who presents from residential treatment services for clearance for alcohol detox program.  Patient reports he has generalized abdominal discomfort over the last several months since he has been drinking heavily.  No tenderness in the right upper quadrant or pain in the right upper quadrant     Physical Exam   Triage Vital Signs: ED Triage Vitals  Encounter Vitals Group     BP 08/02/23 0954 124/89     Systolic BP Percentile --      Diastolic BP Percentile --      Pulse Rate 08/02/23 0954 95     Resp 08/02/23 0954 16     Temp 08/02/23 0954 98 F (36.7 C)     Temp src --      SpO2 08/02/23 0954 99 %     Weight 08/02/23 0955 72.6 kg (160 lb)     Height 08/02/23 0955 1.88 m (6' 2)     Head Circumference --      Peak Flow --      Pain Score 08/02/23 0955 4     Pain Loc --      Pain Education --      Exclude from Growth Chart --     Most recent vital signs: Vitals:   08/02/23 0954  BP: 124/89  Pulse: 95  Resp: 16  Temp: 98 F (36.7 C)  SpO2: 99%     General: Awake, no distress.  CV:  Good peripheral perfusion.  Resp:  Normal effort.  Abd:  No distention.  Soft, nontender, reassuring exam Other:     ED Results / Procedures / Treatments   Labs (all labs ordered are listed, but only abnormal results are displayed) Labs Reviewed  COMPREHENSIVE METABOLIC PANEL - Abnormal; Notable for the following components:      Result Value   Glucose, Bld 100 (*)    Calcium 8.8 (*)    AST 66 (*)    Alkaline Phosphatase 146 (*)    All other components within normal limits  CBC - Abnormal; Notable for the following components:   RDW 18.4 (*)    All other components within normal limits  URINALYSIS, ROUTINE W REFLEX  MICROSCOPIC - Abnormal; Notable for the following components:   Color, Urine YELLOW (*)    APPearance CLEAR (*)    All other components within normal limits  ETHANOL - Abnormal; Notable for the following components:   Alcohol, Ethyl (B) 317 (*)    All other components within normal limits  LIPASE, BLOOD     EKG     RADIOLOGY     PROCEDURES:  Critical Care performed:   Procedures   MEDICATIONS ORDERED IN ED: Medications - No data to display   IMPRESSION / MDM / ASSESSMENT AND PLAN / ED COURSE  I reviewed the triage vital signs and the nursing notes. Patient's presentation is most consistent with exacerbation of chronic illness.  Patient with alcohol dependence, overall well-appearing here and in no acute distress, abdominal exam is reassuring, LFTs are reassuring, patient is cleared for detox program        FINAL CLINICAL IMPRESSION(S) / ED DIAGNOSES   Final diagnoses:  Uncomplicated alcohol dependence (HCC)  Rx / DC Orders   ED Discharge Orders     None        Note:  This document was prepared using Dragon voice recognition software and may include unintentional dictation errors.   Arlander Charleston, MD 08/02/23 [phone]

## 2023-09-18 ENCOUNTER — Other Ambulatory Visit: Payer: Self-pay

## 2023-09-18 ENCOUNTER — Emergency Department
Admission: EM | Admit: 2023-09-18 | Discharge: 2023-09-18 | Disposition: A | Discharge status: Home / Self Care | Payer: MEDICAID | Attending: Emergency Medicine | Admitting: Emergency Medicine

## 2023-09-18 ENCOUNTER — Emergency Department: Payer: MEDICAID

## 2023-09-18 DIAGNOSIS — Y908 Blood alcohol level of 240 mg/100 ml or more: Secondary | ICD-10-CM | POA: Insufficient documentation

## 2023-09-18 DIAGNOSIS — K2920 Alcoholic gastritis without bleeding: Secondary | ICD-10-CM | POA: Insufficient documentation

## 2023-09-18 DIAGNOSIS — R1013 Epigastric pain: Secondary | ICD-10-CM | POA: Diagnosis present

## 2023-09-18 LAB — BASIC METABOLIC PANEL
Anion gap: 11 (ref 5–15)
BUN: 5 mg/dL — ABNORMAL LOW (ref 6–20)
CO2: 25 mmol/L (ref 22–32)
Calcium: 8.7 mg/dL — ABNORMAL LOW (ref 8.9–10.3)
Chloride: 100 mmol/L (ref 98–111)
Creatinine, Ser: 0.72 mg/dL (ref 0.61–1.24)
GFR, Estimated: >60 mL/min (ref >60)
Glucose, Bld: 113 mg/dL — ABNORMAL HIGH (ref 70–99)
Potassium: 3.3 mmol/L — ABNORMAL LOW (ref 3.5–5.1)
Sodium: 136 mmol/L (ref 135–145)

## 2023-09-18 LAB — CBC
HCT: 37.8 % — ABNORMAL LOW (ref 39.0–52.0)
Hemoglobin: 13.3 g/dL (ref 13.0–17.0)
MCH: 31.9 pg (ref 26.0–34.0)
MCHC: 35.2 g/dL (ref 30.0–36.0)
MCV: 90.6 fL (ref 80.0–100.0)
Platelets: 119 10*3/uL — ABNORMAL LOW (ref 150–400)
RBC: 4.17 MIL/uL — ABNORMAL LOW (ref 4.22–5.81)
RDW: 14.5 % (ref 11.5–15.5)
WBC: 4.6 10*3/uL (ref 4.0–10.5)
nRBC: 0 % (ref 0.0–0.2)

## 2023-09-18 LAB — ETHANOL: Alcohol, Ethyl (B): 312 mg/dL (ref <10)

## 2023-09-18 LAB — TROPONIN I (HIGH SENSITIVITY): Troponin I (High Sensitivity): 11 ng/L (ref <18)

## 2023-09-18 LAB — LIPASE, BLOOD: Lipase: 53 U/L — ABNORMAL HIGH (ref 11–51)

## 2023-09-18 MED ORDER — CHLORDIAZEPOXIDE HCL 25 MG PO CAPS: ORAL_CAPSULE | ORAL | 0 refills | Status: AC

## 2023-09-18 MED ORDER — ONDANSETRON 4 MG PO TBDP: 4.0000 mg | ORAL_TABLET | Freq: Three times a day (TID) | ORAL | 0 refills | Status: AC | PRN

## 2023-09-18 MED ORDER — ONDANSETRON 4 MG PO TBDP
4.0000 mg | ORAL_TABLET | Freq: Once | ORAL | Status: AC
  Administered 2023-09-18: 4 mg via ORAL
  Filled 2023-09-18: qty 1

## 2023-09-18 NOTE — ED Provider Notes (Signed)
 [facility] Provider Note    Event Date/Time   First MD Initiated Contact with Patient 09/18/23 1549     (approximate)   History   Chest Pain and Emesis   HPI  Ryan Graham is a 33 y.o. male who reports he is been drinking heavily the last 2 weeks complains of epigastric abdominal discomfort, nausea vomiting occasional discomfort in his chest.  None currently.  No shortness of breath.  No fevers or chills reported.  No calf pain or swelling.     Physical Exam   Triage Vital Signs: ED Triage Vitals  Encounter Vitals Group     BP 09/18/23 1442 134/81     Systolic BP Percentile --      Diastolic BP Percentile --      Pulse Rate 09/18/23 1442 74     Resp 09/18/23 1442 18     Temp 09/18/23 1442 98.3 F (36.8 C)     Temp Source 09/18/23 1442 Oral     SpO2 09/18/23 1442 100 %     Weight 09/18/23 1443 72.6 kg (160 lb)     Height 09/18/23 1443 1.88 m (6\' 2" )     Head Circumference --      Peak Flow --      Pain Score 09/18/23 1442 8     Pain Loc --      Pain Education --      Exclude from Growth Chart --     Most recent vital signs: Vitals:   09/18/23 1442  BP: 134/81  Pulse: 74  Resp: 18  Temp: 98.3 F (36.8 C)  SpO2: 100%     General: Awake, no distress.   CV:  Good peripheral perfusion.  Regular rate and rhythm Resp:  Normal effort.  Clear to auscultation bilaterally Abd:  No distention.  Mild tenderness epigastrium, minimal Other:  No calf pain or swelling   ED Results / Procedures / Treatments   Labs (all labs ordered are listed, but only abnormal results are displayed) Labs Reviewed  BASIC METABOLIC PANEL - Abnormal; Notable for the following components:      Result Value   Potassium 3.3 (*)    Glucose, Bld 113 (*)    BUN 5 (*)    Calcium 8.7 (*)    All other components within normal limits  CBC - Abnormal; Notable for the following components:   RBC 4.17 (*)    HCT 37.8 (*)    Platelets 119 (*)    All other  components within normal limits  LIPASE, BLOOD - Abnormal; Notable for the following components:   Lipase 53 (*)    All other components within normal limits  ETHANOL - Abnormal; Notable for the following components:   Alcohol, Ethyl (B) 312 (*)    All other components within normal limits  TROPONIN I (HIGH SENSITIVITY)     EKG  ED ECG REPORT I, Jene Every, the attending physician, personally viewed and interpreted this ECG.  Date: 09/18/2023  Rhythm: normal sinus rhythm QRS Axis: normal Intervals: normal ST/T Wave abnormalities: normal Narrative Interpretation: no evidence of acute ischemia    RADIOLOGY Chest x-ray viewed interpret by me, no acute abnormality   PROCEDURES:  Critical Care performed:   Procedures   MEDICATIONS ORDERED IN ED: Medications  ondansetron (ZOFRAN-ODT) disintegrating tablet 4 mg (4 mg Oral Given 09/18/23 1614)     IMPRESSION / MDM / ASSESSMENT AND PLAN / ED COURSE  I reviewed the  triage vital signs and the nursing notes. Patient's presentation is most consistent with acute presentation with potential threat to life or bodily function.  Patient presents with epigastric discomfort, chest discomfort as detailed above.  Does admit to heavy alcohol use.  Suspicious for gastritis, esophagitis, doubt ACS, doubt pneumonia.  High sensitive troponin is reassuring, chest x-ray is without acute abnormality.  Blood work is generally reassuring  Alcohol level is significantly elevated.  Will treat with ODT Zofran, Librium to help with detox, outpatient follow-up with [facility] recommended.  No indication for admission at this time, patient feeling much better after treatment.       FINAL CLINICAL IMPRESSION(S) / ED DIAGNOSES   Final diagnoses:  Acute alcoholic gastritis without hemorrhage     Rx / DC Orders   ED Discharge Orders          Ordered    chlordiazePOXIDE (LIBRIUM) 25 MG capsule  Multiple Frequencies        09/18/23 1606     ondansetron (ZOFRAN-ODT) 4 MG disintegrating tablet  Every 8 hours PRN        09/18/23 1606             Note:  This document was prepared using Dragon voice recognition software and may include unintentional dictation errors.   Jene Every, MD 09/18/23 [phone]

## 2023-09-18 NOTE — ED Triage Notes (Signed)
 Patient reports he relapsed and drinks all day everyday but has had nausea vomiting and chest pain x 3 days.  Reports chest pain goes into his back.  REports he can't remember anything.
